# Patient Record
Sex: Female | Born: 1945 | Race: White | Hispanic: No | Marital: Married | Smoking: Never smoker
Health system: Southern US, Community
[De-identification: ages and names within clinical notes are randomized; demographics above are authoritative.]

## PROBLEM LIST (undated history)

## (undated) DIAGNOSIS — G4700 Insomnia, unspecified: Secondary | ICD-10-CM

## (undated) HISTORY — DX: Insomnia, unspecified: G47.00

---

## 1968-04-27 HISTORY — PX: APPENDECTOMY: SHX54

## 1998-02-20 ENCOUNTER — Ambulatory Visit: Admission: RE | Admit: 1998-02-20 | Discharge: 1998-02-20 | Payer: Self-pay | Admitting: Family Medicine

## 1998-02-20 ENCOUNTER — Encounter: Payer: Self-pay | Admitting: Family Medicine

## 1999-05-02 ENCOUNTER — Other Ambulatory Visit: Admission: RE | Admit: 1999-05-02 | Discharge: 1999-05-02 | Payer: Self-pay | Admitting: Family Medicine

## 1999-09-09 ENCOUNTER — Ambulatory Visit (HOSPITAL_COMMUNITY): Admission: RE | Admit: 1999-09-09 | Discharge: 1999-09-09 | Payer: Self-pay | Admitting: Family Medicine

## 1999-09-09 ENCOUNTER — Encounter: Payer: Self-pay | Admitting: Family Medicine

## 2000-05-17 ENCOUNTER — Encounter: Payer: Self-pay | Admitting: Emergency Medicine

## 2000-05-18 ENCOUNTER — Inpatient Hospital Stay (HOSPITAL_COMMUNITY): Admission: EM | Admit: 2000-05-18 | Discharge: 2000-05-18 | Payer: Self-pay | Admitting: Emergency Medicine

## 2001-01-10 ENCOUNTER — Encounter: Admission: RE | Admit: 2001-01-10 | Discharge: 2001-01-10 | Payer: Self-pay | Admitting: Family Medicine

## 2001-01-10 ENCOUNTER — Encounter: Payer: Self-pay | Admitting: Family Medicine

## 2002-10-04 ENCOUNTER — Ambulatory Visit (HOSPITAL_COMMUNITY): Admission: RE | Admit: 2002-10-04 | Discharge: 2002-10-04 | Payer: Self-pay | Admitting: Gastroenterology

## 2002-12-06 ENCOUNTER — Ambulatory Visit (HOSPITAL_COMMUNITY): Admission: RE | Admit: 2002-12-06 | Discharge: 2002-12-06 | Payer: Self-pay | Admitting: Family Medicine

## 2002-12-06 ENCOUNTER — Encounter: Payer: Self-pay | Admitting: Family Medicine

## 2004-05-30 ENCOUNTER — Other Ambulatory Visit: Admission: RE | Admit: 2004-05-30 | Discharge: 2004-05-30 | Payer: Self-pay | Admitting: Family Medicine

## 2004-07-22 ENCOUNTER — Ambulatory Visit (HOSPITAL_COMMUNITY): Admission: RE | Admit: 2004-07-22 | Discharge: 2004-07-22 | Payer: Self-pay | Admitting: Cardiology

## 2005-03-31 ENCOUNTER — Ambulatory Visit (HOSPITAL_COMMUNITY): Admission: RE | Admit: 2005-03-31 | Discharge: 2005-03-31 | Payer: Self-pay | Admitting: Family Medicine

## 2006-06-22 ENCOUNTER — Ambulatory Visit (HOSPITAL_COMMUNITY): Admission: RE | Admit: 2006-06-22 | Discharge: 2006-06-22 | Payer: Self-pay | Admitting: Family Medicine

## 2006-06-22 ENCOUNTER — Other Ambulatory Visit: Admission: RE | Admit: 2006-06-22 | Discharge: 2006-06-22 | Payer: Self-pay | Admitting: Family Medicine

## 2007-07-01 ENCOUNTER — Other Ambulatory Visit: Admission: RE | Admit: 2007-07-01 | Discharge: 2007-07-01 | Payer: Self-pay | Admitting: Obstetrics and Gynecology

## 2008-03-02 ENCOUNTER — Encounter: Admission: RE | Admit: 2008-03-02 | Discharge: 2008-03-02 | Payer: Self-pay | Admitting: Family Medicine

## 2008-03-13 ENCOUNTER — Encounter: Admission: RE | Admit: 2008-03-13 | Discharge: 2008-03-13 | Payer: Self-pay | Admitting: Family Medicine

## 2009-02-18 ENCOUNTER — Encounter: Admission: RE | Admit: 2009-02-18 | Discharge: 2009-02-18 | Payer: Self-pay | Admitting: Family Medicine

## 2009-03-04 ENCOUNTER — Encounter: Admission: RE | Admit: 2009-03-04 | Discharge: 2009-03-04 | Payer: Self-pay | Admitting: Family Medicine

## 2010-05-18 ENCOUNTER — Encounter: Payer: Self-pay | Admitting: Family Medicine

## 2010-09-12 NOTE — Discharge Summary (Signed)
[facility]  Patient:    Carla Huber, Carla Huber                    MRN: [id number] Adm. Date:  [id number] Disc. Date: 05/18/00 Attending:  Benita Stabile CC:         Dyanne Carrel, M.D.  Armanda Magic, M.D.   Discharge Summary  DISCHARGE DIAGNOSES: 1. Substernal chest pressure, the patient ruled out for MI. 2. Status post appendectomy. 3. Allergies to NSAIDS and SUDAFED.  DISCHARGE MEDICATIONS:  Aspirin 81 mg p.o. q.d.  CONDITION ON DISCHARGE:  The patient is denying chest pain, shortness of breath, nausea or vomiting. Has stable vital signs.  REASON FOR ADMISSION:  The patient is a 65 year old otherwise healthy female who was admitted for acute onset substernal chest pressure approximately 3-5/10 associated with nausea after arriving to the emergency room. She reported resolution of her chest pain after two nitroglycerin tablets. She notes that the onset of chest pressure was subsequent to activity. She denies any prior similar episodes.  HOSPITAL COURSE BY PROBLEM:  #1 - SUBSTERNAL CHEST PRESSURE. The patient was admitted to telemetry, initiated on aspirin and a beta blocker. Her chest pain had since resolved and did not recur during her hospital stay. An EKG did not reveal any acute ischemic changes. Normal sinus rhythm in the 60s with poor R wave progression otherwise normal. A fasting lipid panel was normal with an LDL of 80 and a total cholesterol of 158. Given the patients resolution of her presenting symptoms and no evidence of myocardial infarction, she will be discharged to home with follow-up on Thursday two days following discharge for a risk stratification as determined by Dr. Armanda Magic of cardiology.  The patient can follow-up with Dr. Manus Gunning, her primary care Augustin Bun, as needed.  DISCHARGE LABORATORY DATA:  Hemoglobin of 13.5, creatinine of 0.9, INR of 1.1, white count of 7, cholesterol of 158, LDL of 80.  Chest  x-ray no active disease. EKG as described above. CMET was within normal limits. DD:  05/18/00 TD:  05/18/00 Job: [id number]

## 2010-09-12 NOTE — H&P (Signed)
[facility]  Patient:    Carla Huber, Carla Huber                    MRN: [id number] Adm. Date:  [id number] Attending:  Benita Stabile                         History and Physical  CHIEF COMPLAINT:  Chest pain.  HISTORY OF PRESENT ILLNESS:  Patient is a 65 year old white female who was in her usual state until approximately four hours ago when she had done some lifting, carrying some suitcases, and relatively soon thereafter, started having some pressing sternal chest pain with tingling into her left arm and some shortness of breath but no nausea or diaphoresis.  She had not ever had any problems with this and had not done any other unusual activity.  She did do a seven-mile hike on [place]th [place] with no difficulty or problems.  She took an aspirin at home and did not notice much difference with that.  She has had GI cocktail, nitroglycerin and then morphine, with complete resolution of the pain, though she feels the lingering after-effects of the discomfort.  She is not currently having any pain.  PAST MEDICAL HISTORY:  Positive for appendectomy.  MEDICATIONS:  None.  ALLERGIES:  NSAID and SUDAFED.  SOCIAL HISTORY:  She does not smoke.  She has seven to eight glasses of wine per week.  She does occasional walking and she lives with her husband and works as a Runner, broadcasting/film/video.  FAMILY HISTORY:  Positive for an MI in her father at the age of 78.  Positive for congestive heart failure in her mother but no other known cardiac history.  REVIEW OF SYSTEMS:  Review of systems is negative for difficulty with cough, bowel movements, urination, bones, joints, numbness, tingling, weakness or any other new symptoms besides those listed above.  PHYSICAL EXAMINATION  GENERAL:  Well-developed, well-nourished white female in no apparent distress.  VITAL SIGNS:  Temperature 98, pulse 68, respirations 20, blood pressure 169/95.  HEENT:   Unremarkable.  NECK:  Good carotid upstroke with no bruits and no JVD or adenopathy.  LUNGS:  Clear with good air movement and no rales or wheeze.  CV:  Regular with no MGR.  CHEST:  Nontender to palpation and there is no visible or palpable defect.  ABDOMEN:  Soft with mild diffuse tenderness but no mass, guarding or rebound and bowel sounds normal.  EXTREMITIES:  Good ROM of all joints with normal pulses and no swelling.  NEUROLOGIC:  Exam reveals no focal changes.  LABORATORY AND X-RAY FINDINGS:  EKG shows possible ST changes in the anterior leads but no different from 1998 with EKG in the office.  Chest x-ray shows no active disease.  CK-MB and troponin are both negative.  Pro time, PTT, comprehensive metabolic and CBC are all normal.  IMPRESSION 1. Chest pain that has resolved, with possible cardiac versus other source. 2. Otherwise generally healthy.  PLAN:  Rule out MI labs and EKG, telemetry and observation.  Further plan will be based on the results of these but she will at least need probable Cardiolite for further evaluation if the testing is all negative. DD:  05/18/00 TD:  05/18/00 Job: [id number]

## 2010-09-12 NOTE — Op Note (Signed)
   NAME:  Carla Huber, Carla Huber                       ACCOUNT NO.:  [id number]   MEDICAL RECORD NO.:  [id number]                   PATIENT TYPE:  AMB   LOCATION:  ENDO                                 FACILITY:  [facility]   PHYSICIAN:  Graylin Shiver, M.D.                DATE OF BIRTH:  September 28, 1945   DATE OF PROCEDURE:  10/04/2002  DATE OF DISCHARGE:                                 OPERATIVE REPORT   PROCEDURE PERFORMED:  Colonoscopy.   INDICATIONS FOR PROCEDURE:  Intermittent rectal bleeding.   Informed consent was obtained.   PREMEDICATION:  Fentanyl 50 mcg IV, Versed 5 mg IV.   DESCRIPTION OF PROCEDURE:  With the patient in the left lateral decubitus  position, a rectal exam was performed and no masses were felt.  The Olympus  colonoscope was inserted into the rectum and advanced around the colon to  the cecum.  The cecal landmarks were identified.  The cecum and ascending  colon were normal.  The transverse colon was normal.  The descending colon,  sigmoid and rectum were normal.  The scope was brought out.  The patient  tolerated the procedure well without complications.  Inspection of the anal  area did show some external hemorrhoidal tags.   IMPRESSION:  Normal colonoscopy to the cecum with external hemorrhoidal tags  present.                                               Graylin Shiver, M.D.    Germain Osgood  D:  10/04/2002  T:  10/04/2002  Job:  [id number]   cc:   Bryan Lemma. Manus Gunning, M.D.  [address] [place]  [place] [address]  Fax: [phone]

## 2012-02-29 ENCOUNTER — Other Ambulatory Visit: Payer: Self-pay | Admitting: Internal Medicine

## 2012-02-29 DIAGNOSIS — Z1231 Encounter for screening mammogram for malignant neoplasm of breast: Secondary | ICD-10-CM

## 2012-03-29 ENCOUNTER — Ambulatory Visit
Admission: RE | Admit: 2012-03-29 | Discharge: 2012-03-29 | Disposition: A | Discharge status: Home / Self Care | Payer: Medicare Other | Source: Ambulatory Visit | Attending: Internal Medicine | Admitting: Internal Medicine

## 2012-03-29 DIAGNOSIS — Z1231 Encounter for screening mammogram for malignant neoplasm of breast: Secondary | ICD-10-CM

## 2014-01-23 ENCOUNTER — Other Ambulatory Visit: Payer: Self-pay

## 2014-01-23 ENCOUNTER — Ambulatory Visit
Admission: RE | Admit: 2014-01-23 | Discharge: 2014-01-23 | Disposition: A | Discharge status: Home / Self Care | Payer: Medicare Other | Source: Ambulatory Visit

## 2014-01-23 DIAGNOSIS — Z1231 Encounter for screening mammogram for malignant neoplasm of breast: Secondary | ICD-10-CM

## 2014-01-24 ENCOUNTER — Other Ambulatory Visit: Payer: Self-pay | Admitting: Internal Medicine

## 2014-01-24 DIAGNOSIS — N644 Mastodynia: Secondary | ICD-10-CM

## 2014-01-30 ENCOUNTER — Encounter (INDEPENDENT_AMBULATORY_CARE_PROVIDER_SITE_OTHER): Payer: Self-pay

## 2014-01-30 ENCOUNTER — Ambulatory Visit
Admission: RE | Admit: 2014-01-30 | Discharge: 2014-01-30 | Disposition: A | Discharge status: Home / Self Care | Payer: Medicare Other | Source: Ambulatory Visit | Attending: Internal Medicine | Admitting: Internal Medicine

## 2014-01-30 DIAGNOSIS — N644 Mastodynia: Secondary | ICD-10-CM

## 2014-05-09 ENCOUNTER — Ambulatory Visit (INDEPENDENT_AMBULATORY_CARE_PROVIDER_SITE_OTHER): Payer: Medicare Other | Admitting: Cardiovascular Disease

## 2014-05-09 ENCOUNTER — Encounter: Payer: Self-pay | Admitting: Cardiovascular Disease

## 2014-05-09 VITALS — BP 132/76 | HR 67 | Ht 65.5 in | Wt 144.4 lb

## 2014-05-09 DIAGNOSIS — R002 Palpitations: Secondary | ICD-10-CM

## 2014-05-09 HISTORY — DX: Palpitations: R00.2

## 2014-05-09 NOTE — Assessment & Plan Note (Signed)
Helga presents with history of palpitations. Suspect that she has premature ventricular contractions or premature atrial contractions. Chest the air sounds. They're not associated with any specific symptoms. They're also not associated with any specific activities.  We'll place a 30 day monitor her for further evaluation. Paragraph we'll see her again in 3 months. She'll try to keep track of what seems to bring on these palpitations.

## 2014-05-09 NOTE — Patient Instructions (Signed)
Your physician has recommended that you wear an event monitor. Event monitors are medical devices that record the heart's electrical activity. Doctors most often us these monitors to diagnose arrhythmias. Arrhythmias are problems with the speed or rhythm of the heartbeat. The monitor is a small, portable device. You can wear one while you do your normal daily activities. This is usually used to diagnose what is causing palpitations/syncope (passing out).  Your physician wants you to follow-up in: 3 months with Dr. Elease HashimotoNahser.  You will receive a reminder letter in the mail two months in advance. If you don't receive a letter, please call our office to schedule the follow-up appointment.

## 2014-05-09 NOTE — Progress Notes (Signed)
     Ezequiel EssexHelen V Gauthier Date of Birth  1945-12-17       [facility]    [place] [facility] [address]  [address], [address] [place], [place]  [address]   [place]  [id number] [phone]     [phone]   Fax  [phone]     Fax [phone]  Problem List: When. Heart murmur by 2. Palpitations  History of Present Illness:  Satira MccallumHelga is referred today by Dr. Waynard EdwardsPerini for evaluation of a heart murmur and palpitation  Helga has occasional episodes or random CP.  Only last for a brief moment.  Exercises regularly.  [place] and [place].    She has palpitations - at various times.  Not related to eating or drinking, not related to caffiene, , or change of position. Drinks half caffiene, / half decaf.  The palpitations feel like a flutter, lasts for less than a minute.  Not associated with lightheadedness, CHest pain,    Current Outpatient Prescriptions  Medication Sig Dispense Refill  . FLUoxetine (PROZAC) 20 MG capsule Take 20 mg by mouth daily.    . traZODone (DESYREL) 50 MG tablet Take 50 mg by mouth daily. AT BEDTIME FOR SLEEP     No current facility-administered medications for this visit.     No Known Allergies  Past Medical History  Diagnosis Date  . Insomnia     Past Surgical History  Procedure Laterality Date  . Appendectomy  1970    History  Smoking status  . Never Smoker   Smokeless tobacco  . Not on file    History  Alcohol Use  . 0.0 oz/week  . 0 Not specified per week    Comment: DAILY GLASS OF WINE    Family History  Problem Relation Age of Onset  . Uterine cancer Mother   . Heart failure Mother   . Heart attack Father   . Lymphoma Sister   . Heart attack Maternal Grandmother   . Heart attack Maternal Grandfather   . Heart attack Paternal Grandmother   . Heart attack Paternal Grandfather     Reviw of Systems:  Reviewed in the HPI.  All other systems are negative.  Physical  Exam: Blood pressure 132/76, pulse 67, height 5' 5.5" (1.664 m), weight 144 lb 6.4 oz (65.499 kg). Wt Readings from Last 3 Encounters:  05/09/14 144 lb 6.4 oz (65.499 kg)     General: Well developed, well nourished, in no acute distress.  Head: Normocephalic, atraumatic, sclera non-icteric, mucus membranes are moist,   Neck: Supple. Carotids are 2 + without bruits. No JVD   Lungs: Clear   Heart: Rr, normal S1S2  Abdomen: Soft, non-tender, non-distended with normal bowel sounds.  Msk:  Strength and tone are normal   Extremities: No clubbing or cyanosis. No edema.  Distal pedal pulses are 2+ and equal   Neuro: CN II - XII intact.  Alert and oriented X 3.   Psych:  Normal   ECG: 05/09/2014:  Normal sinus rhythm with first degree AV block.  Assessment / Plan:

## 2014-05-10 ENCOUNTER — Encounter: Payer: Self-pay | Admitting: *Deleted

## 2014-05-10 DIAGNOSIS — R002 Palpitations: Secondary | ICD-10-CM

## 2014-05-10 NOTE — Progress Notes (Signed)
Patient ID: Carla EssexHelen V Sigg, female   DOB: May 23, 1945, 69 y.o.   MRN: [id number] [employer] 30 day cardiac event monitor applied to patient.  [employer] notified to mail patient electrodes for sensitive skin.

## 2014-06-14 ENCOUNTER — Telehealth: Payer: Self-pay | Admitting: Nurse Practitioner

## 2014-06-14 NOTE — Telephone Encounter (Signed)
Left message for patient to call back for monitor results.  Per Dr. Elease HashimotoNahser:  Normal sinus rhythm with rare PAC's and PVC's

## 2014-06-18 NOTE — Telephone Encounter (Signed)
Spoke with patient and reviewed monitor results.  Patient verbalized understanding and will follow up with Dr. Elease HashimotoNahser as directed in May.

## 2014-08-08 ENCOUNTER — Ambulatory Visit (INDEPENDENT_AMBULATORY_CARE_PROVIDER_SITE_OTHER): Payer: Medicare Other | Admitting: Cardiovascular Disease

## 2014-08-08 ENCOUNTER — Encounter: Payer: Self-pay | Admitting: Cardiovascular Disease

## 2014-08-08 VITALS — BP 120/70 | HR 63 | Ht 65.5 in | Wt 145.1 lb

## 2014-08-08 DIAGNOSIS — R002 Palpitations: Secondary | ICD-10-CM | POA: Diagnosis not present

## 2014-08-08 NOTE — Progress Notes (Signed)
Cardiology Office Note   Date:  08/08/2014   ID:  Carla Huber, DOB 08-17-1945, MRN [id number]  PCP:  Ezequiel Kayser, MD  Cardiologist:   Vesta Mixer, MD   Chief Complaint  Patient presents with  . Palpitations   1. Heart murmur 2. Palpitations - PACs on event monitor    History of Present Illness: Carla Huber is a 69 y.o. female who presents for follow-up of her palpitations. She had an event monitor which revealed several premature atrial contractions. She's done very well. She still occasionally has premature atrial contractions but they do not cause any shortness of breath, chest pain or dizziness. She has remained active. She exercises on a regular basis.     Past Medical History  Diagnosis Date  . Insomnia     Past Surgical History  Procedure Laterality Date  . Appendectomy  1970     Current Outpatient Prescriptions  Medication Sig Dispense Refill  . FLUoxetine (PROZAC) 20 MG capsule Take 20 mg by mouth daily.    . traZODone (DESYREL) 50 MG tablet Take 50 mg by mouth daily. AT BEDTIME FOR SLEEP     No current facility-administered medications for this visit.    Allergies:   Review of patient's allergies indicates no known allergies.    Social History:  The patient  reports that she has never smoked. She does not have any smokeless tobacco history on file. She reports that she drinks alcohol. She reports that she does not use illicit drugs.   Family History:  The patient's family history includes Heart attack in her father, maternal grandfather, maternal grandmother, paternal grandfather, and paternal grandmother; Heart failure in her mother; Lymphoma in her sister; Uterine cancer in her mother.    ROS:  Please see the history of present illness.    Review of Systems: Constitutional:  denies fever, chills, diaphoresis, appetite change and fatigue.  HEENT: denies photophobia, eye pain, redness, hearing loss, ear pain, congestion, sore throat,  rhinorrhea, sneezing, neck pain, neck stiffness and tinnitus.  Respiratory: denies SOB, DOE, cough, chest tightness, and wheezing.  Cardiovascular: denies chest pain, palpitations and leg swelling.  Gastrointestinal: denies nausea, vomiting, abdominal pain, diarrhea, constipation, blood in stool.  Genitourinary: denies dysuria, urgency, frequency, hematuria, flank pain and difficulty urinating.  Musculoskeletal: denies  myalgias, back pain, joint swelling, arthralgias and gait problem.   Skin: denies pallor, rash and wound.  Neurological: denies dizziness, seizures, syncope, weakness, light-headedness, numbness and headaches.   Hematological: denies adenopathy, easy bruising, personal or family bleeding history.  Psychiatric/ Behavioral: denies suicidal ideation, mood changes, confusion, nervousness, sleep disturbance and agitation.       All other systems are reviewed and negative.    PHYSICAL EXAM: VS:  BP 120/70 mmHg  Pulse 63  Ht 5' 5.5" (1.664 m)  Wt 145 lb 1.9 oz (65.826 kg)  BMI 23.77 kg/m2 , BMI Body mass index is 23.77 kg/(m^2). GEN: Well nourished, well developed, in no acute distress HEENT: normal Neck: no JVD, carotid bruits, or masses Cardiac: RRR; no murmurs, rubs, or gallops,no edema  Respiratory:  clear to auscultation bilaterally, normal work of breathing GI: soft, nontender, nondistended, + BS MS: no deformity or atrophy Skin: warm and dry, no rash Neuro:  Strength and sensation are intact Psych: normal   EKG:  EKG is not ordered today.    Recent Labs: No results found for requested labs within last 365 days.    Lipid Panel No results found for:  CHOL, TRIG, HDL, CHOLHDL, VLDL, LDLCALC, LDLDIRECT    Wt Readings from Last 3 Encounters:  08/08/14 145 lb 1.9 oz (65.826 kg)  05/09/14 144 lb 6.4 oz (65.499 kg)      Other studies Reviewed: Additional studies/ records that were reviewed today include: . Review of the above records demonstrates:     ASSESSMENT AND PLAN:  1.  Palpitations:   Monitor revealed premature atrial contractions. These are completely benign. She does not need any specific therapy.  2. Heart murmur: She has a very soft heart murmur. She may have trivial mitral regurgitation or trivial tricuspid regurgitation. I do not think that she needs any additional follow-up. I'll be happy to see her on an as-needed basis.   Current medicines are reviewed at length with the patient today.  The patient does not have concerns regarding medicines.  The following changes have been made:  no change  Labs/ tests ordered today include:  No orders of the defined types were placed in this encounter.     Disposition:   FU with me as needed.      Signed, Kripa Foskey, Deloris PingPhilip J, MD  08/08/2014 4:01 PM    [facility] [address], [place], [place]  [address] Phone: [phone]; Fax: [phone]

## 2014-08-08 NOTE — Patient Instructions (Signed)
Medication Instructions:  Your physician recommends that you continue on your current medications as directed. Please refer to the Current Medication list given to you today.   Labwork: None  Testing/Procedures: None  Follow-Up: Your physician recommends that you schedule a follow-up appointment in: as needed with Dr. Nahser      

## 2015-01-04 ENCOUNTER — Inpatient Hospital Stay: Admission: RE | Admit: 2015-01-04 | Payer: Medicare Other | Source: Ambulatory Visit

## 2015-01-04 ENCOUNTER — Other Ambulatory Visit: Payer: Self-pay | Admitting: Internal Medicine

## 2015-01-04 DIAGNOSIS — R0789 Other chest pain: Secondary | ICD-10-CM

## 2015-02-21 ENCOUNTER — Ambulatory Visit (INDEPENDENT_AMBULATORY_CARE_PROVIDER_SITE_OTHER): Payer: Medicare Other | Admitting: Pulmonary Disease

## 2015-02-21 VITALS — BP 120/68 | HR 66 | Temp 97.6°F | Wt 145.0 lb

## 2015-02-21 DIAGNOSIS — Z779 Other contact with and (suspected) exposures hazardous to health: Secondary | ICD-10-CM | POA: Insufficient documentation

## 2015-02-21 DIAGNOSIS — R05 Cough: Secondary | ICD-10-CM | POA: Insufficient documentation

## 2015-02-21 DIAGNOSIS — T7589XA Other specified effects of external causes, initial encounter: Secondary | ICD-10-CM

## 2015-02-21 DIAGNOSIS — IMO0001 Reserved for inherently not codable concepts without codable children: Secondary | ICD-10-CM

## 2015-02-21 DIAGNOSIS — R0600 Dyspnea, unspecified: Secondary | ICD-10-CM | POA: Insufficient documentation

## 2015-02-21 DIAGNOSIS — R059 Cough, unspecified: Secondary | ICD-10-CM

## 2015-02-21 HISTORY — DX: Other contact with and (suspected) exposures hazardous to health: Z77.9

## 2015-02-21 HISTORY — DX: Cough, unspecified: R05.9

## 2015-02-21 HISTORY — DX: Dyspnea, unspecified: R06.00

## 2015-02-21 MED ORDER — BUDESONIDE-FORMOTEROL FUMARATE 160-4.5 MCG/ACT IN AERO: 2.0000 | INHALATION_SPRAY | Freq: Two times a day (BID) | RESPIRATORY_TRACT | Status: DC

## 2015-02-21 MED ORDER — CLONAZEPAM 0.5 MG PO TABS: 0.5000 mg | ORAL_TABLET | Freq: Two times a day (BID) | ORAL | Status: DC

## 2015-02-21 NOTE — Patient Instructions (Signed)
Carla JacobsonHelen-- it was nice meeting you today...  It appears to me that your exposure to the chlorox in Sept caused some irritation to your airways and produced this reaction...  Your prev CXRs are reported clear and your Spirometry breathing test today showed some small airways inflammation     along with some mild restriction that is likely from some chest wall musc spasm...  We decided to treat BOTH components to help eliminate your symptoms (shortness of breath & cough)...     In this regard I would like you to continue the SYMBICORT160- 2 sprays twice daily & you may use the Proair rescue inhaler as needed...    For the restrictive component we decided to give you a trial of a combination relaxer to relax those chest wall muscles- KLONOPIN 0.5mg  take one tab twice daily...  Please give me a call in about 2 weeks to let me know how you are doing (we can adjust doses at that time if necessary)...  Let's plan a follow up visit in 4-6weeks, sooner if needed for any problems.Marland Kitchen..Marland Kitchen

## 2015-02-23 ENCOUNTER — Encounter: Payer: Self-pay | Admitting: Pulmonary Disease

## 2015-02-23 NOTE — Progress Notes (Signed)
Subjective:     Patient ID: Carla Huber, female   DOB: 01-21-1946, 69 y.o.   MRN: [id number]  HPI ~  February 21, 2015:  Initial pulmonary consult w/ SN>        Latrish tells me she was recently diagnosed w/ asthma w/ symptoms atarting 11/2014 when she noted a leaky roof, black mold on her ceiling, cleaned it w/ chlorox & she feels that she had a reaction to it "like an alien was in my chest" w/ wheezing, coughing, sm amt clear sput, no hemoptysis, and noted this was had to shake off; she had CXR from DrPerini which was reported clear, and treated w/ Breo, Singulair, Zyrtek, and her 1st round of Prednisone; she noted mild benefit but symptoms were not resolved so they check a D-dimer (elev) which led to a CT Angio- Neg, no PE, and she was given another round of Pred; then she went on vacation to [place] (9/16) & did very well in that environment w/ cool/ dry air; on return to [place] her symptoms recurred w/ cough/ wheezing/ SOB this time w/ small amt yellow sputum & chest discomfort/ tightness; on further questioning she notes the dyspnea is "like I can't get a full breath" but she has continued her exercise w/ walking regularly & doing satis she says;  When she saw DrPerini about 2weeks ago- she was switched to Symbicort, given Proair for prn use, and a 3rd round of Pred...   Smoking Hx>  She was a minimal smoker in the past, smoking ~1/2ppd betw ages [age] for a total 1 pack-yr smoking hx!  She denies much 2nd hand exposure in her life as well, she notes that they occas heat w/ wood stove...   Pulmonary Hx>  No mention of asthma as a child or into adulthood until this illness in Aug2016; she had pneumonia x1 in high school & describes a serious illness, hosp x3wks, but made a full recovery w/o known sequelae...    Medical Hx>  Good general medical health- Hx of migraines and remote bout of encephalitis in 1980, post-menopausal, osteopenia, mild DJD, hx non-melanoma skin cancers, hx depression...   Family Hx>  FamHx is neg for pulmonary diseases; Father had heart dis & CHF...  Occup Hx>  She is a retired Runner, broadcasting/film/video & denies any known hx of toxic exposures to asbestos etc, until this episode w/ the chlorox treatment for the ceiling mold at home...   Current Meds>  Symbicort160-2spBid, Singulair10, ProairHFA prn, ASA325, Vits, prev on Prozac/ Desyrel...      EXAM shows Afeb, VSS, O2sat=99%;  Wt=149#, 5'6"Tall, BMI=24;  HEENT- neg, mallampati1;  Chest- clear, sl dry cough;  Heart- RR w/o m/r/g;  Abd- soft, neg;  Ext- neg w/o c/c/e...  CXR 02/06/15 ([facility]- films not avail) mild cardiomeg & ectatic Ao; clear lungs, ?mild hyperinflation, NAD...   Spirometry 02/21/15 shows FVC=2.20 (71%), FEV1=1.62 (68%), %1sec=74, mid-flows are reduced at 60% predicted... This is c/w mild restriction w/ superimposed small airways dis...   FeNO 02/21/15 shows FeNO level = 28 (nl<25, intermed 25-50, Hi>50)  LABS> 9/16 at [facility]- BMet- wnl;  CBC- wnl;         IMP/PLAN>>  By her history Lianni had normal resp health prior to the chlorox exposure 11/2014 (she had a serious pneumonia in high school but made a full recovery w/ no known problems after that illness);  It appears as though this exposure triggered an irritant reaction within her airways that  trequired several rounds of meds including Pred; curious that her best response occurred while on vacation in [place]; on return she noted to me that the SOB was "like I can't get a full breath" yet she is walking regularly & doing satis... Spirometry showed what looks like mild restriction (I suspect from chest wall factors since lungs are clear) & superimposed small airways dis (likely the resid inflamm from the exposure); in this regard we discussed further treatment w/ KLONOPIN 0.5mg  Bid for the "chest wall muscle spasm" and continue her SYMBICORT160-2spBid & PROAIR-HFA rescue inhaler prn for the airways component... I have asked her to call me  w/ a report on how's she's doing in 2 wks and we will see her back in 4-6wks...     Past Medical History  Diagnosis Date  . Insomnia   NOTES from DrPerini indicates PMH-- migraines and remote bout of encephalitis in 1980, post-menopausal, osteopenia, mild DJD, hx non-melanoma skin cancers, hx depression   Past Surgical History  Procedure Laterality Date  . Appendectomy  1970    Outpatient Encounter Prescriptions as of 02/21/2015  Medication Sig  . aspirin 325 MG tablet Take 175 mg by mouth daily.  Marland Kitchen b complex vitamins tablet Take 1 tablet by mouth daily.  Marland Kitchen FLUoxetine (PROZAC) 20 MG capsule Take 20 mg by mouth daily.  . magnesium oxide (MAG-OX) 400 MG tablet Take 400 mg by mouth daily.  . montelukast (SINGULAIR) 10 MG tablet Take 10 mg by mouth at bedtime.  . traZODone (DESYREL) 50 MG tablet Take 50 mg by mouth daily. AT BEDTIME FOR SLEEP  . vitamin C (ASCORBIC ACID) 250 MG tablet Take 250 mg by mouth daily.  . budesonide-formoterol (SYMBICORT) 160-4.5 MCG/ACT inhaler Inhale 2 puffs into the lungs 2 (two) times daily.    No Known Allergies   Family History  Problem Relation Age of Onset  . Uterine cancer Mother   . Heart failure Mother   . Heart attack Father   . Lymphoma Sister   . Heart attack Maternal Grandmother   . Heart attack Maternal Grandfather   . Heart attack Paternal Grandmother   . Heart attack Paternal Grandfather     Social History   Social History  . Marital Status: Married    Spouse Name: N/A  . Number of Children: N/A  . Years of Education: N/A   Occupational History  . Not on file.   Social History Main Topics  . Smoking status: Never Smoker   . Smokeless tobacco: Not on file  . Alcohol Use: 0.0 oz/week    0 Standard drinks or equivalent per week     Comment: DAILY GLASS OF WINE  . Drug Use: No  . Sexual Activity: Not on file   Other Topics Concern  . Not on file   Social History Narrative    Current Medications, Allergies, Past  Medical History, Past Surgical History, Family History, and Social History were reviewed in [employer] record.   Review of Systems             All symptoms NEG except where BOLDED >>  Constitutional:  F/C/S, fatigue, anorexia, unexpected weight change. HEENT:  HA, visual changes, hearing loss, earache, nasal symptoms, sore throat, mouth sores, hoarseness. Resp:  cough, sputum, hemoptysis; SOB, tightness, wheezing. Cardio:  CP, palpit, DOE, orthopnea, edema. GI:  N/V/D/C, blood in stool; reflux, abd pain, distention, gas. GU:  dysuria, freq, urgency, hematuria, flank pain, voiding difficulty. MS:  joint pain,  swelling, tenderness, decr ROM; neck pain, back pain, etc. Neuro:  HA, tremors, seizures, dizziness, syncope, weakness, numbness, gait abn. Skin:  suspicious lesions or skin rash. Heme:  adenopathy, bruising, bleeding. Psyche:  confusion, agitation, sleep disturbance, hallucinations, anxiety, depression suicidal.   Objective:   Physical Exam       Vital Signs:  Reviewed...  General:  WD, WN, 69 y/o WF in NAD; alert & oriented; pleasant & cooperative... HEENT:  [place]/AT; Conjunctiva- pink, Sclera- nonicteric, EOM-wnl, PERRLA, Fundi-benign; EACs-clear, TMs-wnl; NOSE-clear; THROAT-clear & wnl. Neck:  Supple w/ fair ROM; no JVD; normal carotid impulses w/o bruits; no thyromegaly or nodules palpated; no lymphadenopathy. Chest:  Clear to P & A; without wheezes, rales, or rhonchi heard. Heart:  Regular Rhythm; norm S1 & S2 without murmurs, rubs, or gallops detected. Abdomen:  Soft & nontender- no guarding or rebound; normal bowel sounds; no organomegaly or masses palpated. Ext:  Normal ROM; without deformities or arthritic changes; no varicose veins, venous insuffic, or edema;  Pulses intact w/o bruits. Neuro:  CNs II-XII intact; motor testing normal; sensory testing normal; gait normal & balance OK. Derm:  No lesions noted; no rash etc. Lymph:  No cervical,  supraclavicular, axillary, or inguinal adenopathy palpated.   Assessment:           IMP/PLAN>>  By her history Myriam JacobsonHelen had normal resp health prior to the chlorox exposure 11/2014 (she had a serious pneumonia in high school but made a full recovery w/ no known problems after that illness);  It appears as though this exposure triggered an irritant reaction within her airways that trequired several rounds of meds including Pred; curious that her best response occurred while on vacation in [place]; on return she noted to me that the SOB was "like I can't get a full breath" yet she is walking regularly & doing satis... Spirometry showed what looks like mild restriction (I suspect from chest wall factors since lungs are clear) & superimposed small airways dis (likely the resid inflamm from the exposure); in this regard we discussed further treatment w/ KLONOPIN 0.5mg  Bid for the "chest wall muscle spasm" and continue her SYMBICORT160-2spBid & PROAIR-HFA rescue inhaler prn for the airways component... I have asked her to call me w/ a report on how's she's doing in 2 wks and we will see her back in 4-6wks.     Plan:     Patient's Medications  New Prescriptions   BUDESONIDE-FORMOTEROL (SYMBICORT) 160-4.5 MCG/ACT INHALER    Inhale 2 puffs into the lungs 2 (two) times daily.   CLONAZEPAM (KLONOPIN) 0.5 MG TABLET    Take 1 tablet (0.5 mg total) by mouth 2 (two) times daily.  Previous Medications   ASPIRIN 325 MG TABLET    Take 175 mg by mouth daily.   B COMPLEX VITAMINS TABLET    Take 1 tablet by mouth daily.   FLUOXETINE (PROZAC) 20 MG CAPSULE    Take 20 mg by mouth daily.   MAGNESIUM OXIDE (MAG-OX) 400 MG TABLET    Take 400 mg by mouth daily.   MONTELUKAST (SINGULAIR) 10 MG TABLET    Take 10 mg by mouth at bedtime.   TRAZODONE (DESYREL) 50 MG TABLET    Take 50 mg by mouth daily. AT BEDTIME FOR SLEEP   VITAMIN C (ASCORBIC ACID) 250 MG TABLET    Take 250 mg by mouth daily.  Modified Medications   No  medications on file  Discontinued Medications   No medications on file

## 2015-03-28 ENCOUNTER — Ambulatory Visit: Payer: Medicare Other | Admitting: Pulmonary Disease

## 2015-03-28 ENCOUNTER — Encounter: Payer: Self-pay | Admitting: Pulmonary Disease

## 2015-03-28 VITALS — BP 116/70 | HR 62 | Temp 97.8°F | Resp 16 | Wt 148.4 lb

## 2015-03-28 DIAGNOSIS — R059 Cough, unspecified: Secondary | ICD-10-CM

## 2015-03-28 DIAGNOSIS — R05 Cough: Secondary | ICD-10-CM

## 2015-03-28 DIAGNOSIS — T7589XD Other specified effects of external causes, subsequent encounter: Principal | ICD-10-CM

## 2015-03-28 DIAGNOSIS — R0600 Dyspnea, unspecified: Secondary | ICD-10-CM

## 2015-03-28 DIAGNOSIS — IMO0001 Reserved for inherently not codable concepts without codable children: Secondary | ICD-10-CM

## 2015-03-28 NOTE — Progress Notes (Signed)
Subjective:     Patient ID: Carla Huber, female   DOB: June 29, 1945, 69 y.o.   MRN: [id number]  HPI  ~  February 21, 2015:  Initial pulmonary consult w/ SN>        Carla Huber tells me she was recently diagnosed w/ asthma w/ symptoms atarting 11/2014 when she noted a leaky roof, black mold on her ceiling, cleaned it w/ chlorox & she feels that she had a reaction to it "like an alien was in my chest" w/ wheezing, coughing, sm amt clear sput, no hemoptysis, and noted this was had to shake off; she had CXR from DrPerini which was reported clear, and treated w/ Breo, Singulair, Zyrtek, and her 1st round of Prednisone; she noted mild benefit but symptoms were not resolved so they check a D-dimer (elev) which led to a CT Angio- Neg, no PE, and she was given another round of Pred; then she went on vacation to [place] (9/16) & did very well in that environment w/ cool/ dry air; on return to [place] her symptoms recurred w/ cough/ wheezing/ SOB this time w/ small amt yellow sputum & chest discomfort/ tightness; on further questioning she notes the dyspnea is "like I can't get a full breath" but she has continued her exercise w/ walking regularly & doing satis she says;  When she saw DrPerini about 2weeks ago- she was switched to Symbicort, given Proair for prn use, and a 3rd round of Pred...   Smoking Hx>  She was a minimal smoker in the past, smoking ~1/2ppd betw ages [age] for a total 1 pack-yr smoking hx!  She denies much 2nd hand exposure in her life as well, she notes that they occas heat w/ wood stove...   Pulmonary Hx>  No mention of asthma as a child or into adulthood until this illness in Aug2016; she had pneumonia x1 in high school & describes a serious illness, hosp x3wks, but made a full recovery w/o known sequelae...    Medical Hx>  Good general medical health- Hx of migraines and remote bout of encephalitis in 1980, post-menopausal, osteopenia, mild DJD, hx non-melanoma skin cancers, hx depression...   Family Hx>  FamHx is neg for pulmonary diseases; Father had heart dis & CHF...  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   Current Meds>  Symbicort160-2spBid, Singulair10, ProairHFA prn, ASA325, Vits, prev on Prozac/ Desyrel...     EXAM shows Afeb, VSS, O2sat=99%;  Wt=149#, 5'6"Tall, BMI=24;  HEENT- neg, mallampati1;  Chest- clear, sl dry cough;  Heart- RR w/o m/r/g;  Abd- soft, neg;  Ext- neg w/o c/c/e...  [facility]06[facility]15 [facility]) mild cardiomeg & ectatic Ao; clear lungs, ?mild hyperinflation, NAD...   Spirometry 02/21/15 shows FVC=2.20 (71%), FEV1=1.62 (68%), %1sec=74, mid-flows are reduced at 60% predicted... This is c/w mild restriction w/ superimposed small airways dis...   FeNO 02/21/15 shows FeNO level = 28 (nl<25, intermed 25-50, Hi>50)  [facility]- wnl;  CBC- wnl;        IMP/PLAN>>  By her history Carla Huber had normal resp health prior to the chlorox exposure 11/2014 (she had a serious pneumonia in high school but made a full recovery w/ no known problems after that illness);  It appears as though this exposure triggered an irritant reaction within her airways that trequired  several rounds of meds including Pred; curious that her best response occurred while on vacation in [place]; on return she noted to me that the SOB was "like I can't get a full breath" yet she is walking regularly & doing satis... Spirometry showed what looks like mild restriction (I suspect from chest wall factors since lungs are clear) & superimposed small airways dis (likely the resid inflamm from the exposure); in this regard we discussed further treatment w/ KLONOPIN 0.5mg  Bid for the "chest wall muscle spasm" and continue her SYMBICORT160-2spBid & PROAIR-HFA rescue inhaler prn for the airways component... I have asked her to call me w/ a  report on how's she's doing in 2 wks and we will see her back in 4-6wks...   ~  March 27, 2015:  32mo ROV w/ SN>         Past Medical History  Diagnosis Date  . Insomnia   NOTES from DrPerini indicates PMH-- migraines and remote bout of encephalitis in 1980, post-menopausal, osteopenia, mild DJD, hx non-melanoma skin cancers, hx depression   Past Surgical History  Procedure Laterality Date  . Appendectomy  1970    Outpatient Encounter Prescriptions as of 03/28/2015  Medication Sig  . aspirin 325 MG tablet Take 175 mg by mouth daily.  Marland Kitchen b complex vitamins tablet Take 1 tablet by mouth daily.  . budesonide-formoterol (SYMBICORT) 160-4.5 MCG/ACT inhaler Inhale 2 puffs into the lungs 2 (two) times daily.  . clonazePAM (KLONOPIN) 0.5 MG tablet Take 1 tablet (0.5 mg total) by mouth 2 (two) times daily.  Marland Kitchen FLUoxetine (PROZAC) 20 MG capsule Take 20 mg by mouth daily.  . magnesium oxide (MAG-OX) 400 MG tablet Take 400 mg by mouth daily.  . montelukast (SINGULAIR) 10 MG tablet Take 10 mg by mouth at bedtime.  . traZODone (DESYREL) 50 MG tablet Take 50 mg by mouth daily. AT BEDTIME FOR SLEEP  . vitamin C (ASCORBIC ACID) 250 MG tablet Take 250 mg by mouth daily.   No facility-administered encounter medications on file as of 03/28/2015.    No Known Allergies   Current Medications, Allergies, Past Medical History, Past Surgical History, Family History, and Social History were reviewed in [employer] record.   Review of Systems             All symptoms NEG except where BOLDED >>  Constitutional:  F/C/S, fatigue, anorexia, unexpected weight change. HEENT:  HA, visual changes, hearing loss, earache, nasal symptoms, sore throat, mouth sores, hoarseness. Resp:  cough, sputum, hemoptysis; SOB, tightness, wheezing. Cardio:  CP, palpit, DOE, orthopnea, edema. GI:  N/V/D/C, blood in stool; reflux, abd pain, distention, gas. GU:  dysuria, freq, urgency, hematuria,  flank pain, voiding difficulty. MS:  joint pain, swelling, tenderness, decr ROM; neck pain, back pain, etc. Neuro:  HA, tremors, seizures, dizziness, syncope, weakness, numbness, gait abn. Skin:  suspicious lesions or skin rash. Heme:  adenopathy, bruising, bleeding. Psyche:  confusion, agitation, sleep disturbance, hallucinations, anxiety, depression suicidal.   Objective:   Physical Exam       Vital Signs:  Reviewed...  General:  WD, WN, 69 y/o WF in NAD; alert & oriented; pleasant & cooperative... HEENT:  [place]/AT; Conjunctiva- pink, Sclera- nonicteric, EOM-wnl, PERRLA, Fundi-benign; EACs-clear, TMs-wnl; NOSE-clear; THROAT-clear & wnl. Neck:  Supple w/ fair ROM; no JVD; normal carotid impulses w/o bruits; no thyromegaly or nodules palpated; no lymphadenopathy. Chest:  Clear to P & A; without wheezes, rales, or rhonchi heard. Heart:  Regular Rhythm; norm S1 &  S2 without murmurs, rubs, or gallops detected. Abdomen:  Soft & nontender- no guarding or rebound; normal bowel sounds; no organomegaly or masses palpated. Ext:  Normal ROM; without deformities or arthritic changes; no varicose veins, venous insuffic, or edema;  Pulses intact w/o bruits. Neuro:  CNs II-XII intact; motor testing normal; sensory testing normal; gait normal & balance OK. Derm:  No lesions noted; no rash etc. Lymph:  No cervical, supraclavicular, axillary, or inguinal adenopathy palpated.   Assessment:           IMP/PLAN>>  By her history Dejai had normal resp health prior to the chlorox exposure 11/2014 (she had a serious pneumonia in high school but made a full recovery w/ no known problems after that illness);  It appears as though this exposure triggered an irritant reaction within her airways that trequired several rounds of meds including Pred; curious that her best response occurred while on vacation in [place]; on return she noted to me that the SOB was "like I can't get a full breath" yet she is walking regularly &  doing satis... Spirometry showed what looks like mild restriction (I suspect from chest wall factors since lungs are clear) & superimposed small airways dis (likely the resid inflamm from the exposure); in this regard we discussed further treatment w/ KLONOPIN 0.5mg  Bid for the "chest wall muscle spasm" and continue her SYMBICORT160-2spBid & PROAIR-HFA rescue inhaler prn for the airways component... I have asked her to call me w/ a report on how's she's doing in 2 wks and we will see her back in 4-6wks.     Plan:     Patient's Medications  New Prescriptions   No medications on file  Previous Medications   ASPIRIN 325 MG TABLET    Take 175 mg by mouth daily.   B COMPLEX VITAMINS TABLET    Take 1 tablet by mouth daily.   BUDESONIDE-FORMOTEROL (SYMBICORT) 160-4.5 MCG/ACT INHALER    Inhale 2 puffs into the lungs 2 (two) times daily.   CLONAZEPAM (KLONOPIN) 0.5 MG TABLET    Take 1 tablet (0.5 mg total) by mouth 2 (two) times daily.   FLUOXETINE (PROZAC) 20 MG CAPSULE    Take 20 mg by mouth daily.   MAGNESIUM OXIDE (MAG-OX) 400 MG TABLET    Take 400 mg by mouth daily.   MONTELUKAST (SINGULAIR) 10 MG TABLET    Take 10 mg by mouth at bedtime.   TRAZODONE (DESYREL) 50 MG TABLET    Take 50 mg by mouth daily. AT BEDTIME FOR SLEEP   VITAMIN C (ASCORBIC ACID) 250 MG TABLET    Take 250 mg by mouth daily.  Modified Medications   No medications on file  Discontinued Medications   No medications on file

## 2015-03-28 NOTE — Patient Instructions (Signed)
Today we updated your med list in our EPIC system...    Continue your current medications the same...  For the cough & irritation symptoms> use the SYMBICORT 1-2 sprays twice daily for control...  For the shortness of breath & can't get a full breath sensation> use the Klonopin 0.5mg  once or twice daily...  Call for any questions or if we can be of service in any way.Marland Kitchen..Marland Kitchen

## 2015-04-01 ENCOUNTER — Encounter: Payer: Self-pay | Admitting: Pulmonary Disease

## 2015-05-30 ENCOUNTER — Other Ambulatory Visit: Payer: Self-pay | Admitting: Internal Medicine

## 2015-05-30 DIAGNOSIS — R05 Cough: Secondary | ICD-10-CM

## 2015-05-30 DIAGNOSIS — R059 Cough, unspecified: Secondary | ICD-10-CM

## 2015-05-31 ENCOUNTER — Ambulatory Visit
Admission: RE | Admit: 2015-05-31 | Discharge: 2015-05-31 | Disposition: A | Discharge status: Home / Self Care | Payer: Medicare Other | Source: Ambulatory Visit | Attending: Internal Medicine | Admitting: Internal Medicine

## 2015-05-31 ENCOUNTER — Ambulatory Visit: Payer: Medicare Other | Admitting: Acute Care

## 2015-05-31 DIAGNOSIS — R059 Cough, unspecified: Secondary | ICD-10-CM

## 2015-05-31 DIAGNOSIS — R05 Cough: Secondary | ICD-10-CM

## 2015-05-31 MED ORDER — IOPAMIDOL (ISOVUE-300) INJECTION 61%
75.0000 mL | Freq: Once | INTRAVENOUS | Status: AC | PRN
  Administered 2015-05-31: 75 mL via INTRAVENOUS

## 2015-06-03 ENCOUNTER — Other Ambulatory Visit: Payer: Medicare Other

## 2015-06-12 ENCOUNTER — Other Ambulatory Visit: Payer: Medicare Other

## 2015-06-20 ENCOUNTER — Ambulatory Visit (HOSPITAL_COMMUNITY): Payer: Medicare Other | Attending: Cardiovascular Disease

## 2015-06-20 ENCOUNTER — Other Ambulatory Visit: Payer: Self-pay

## 2015-06-20 ENCOUNTER — Other Ambulatory Visit: Payer: Self-pay | Admitting: Internal Medicine

## 2015-06-20 DIAGNOSIS — I272 Other secondary pulmonary hypertension: Secondary | ICD-10-CM | POA: Insufficient documentation

## 2015-06-20 DIAGNOSIS — I517 Cardiomegaly: Secondary | ICD-10-CM | POA: Insufficient documentation

## 2015-06-20 DIAGNOSIS — I351 Nonrheumatic aortic (valve) insufficiency: Secondary | ICD-10-CM | POA: Insufficient documentation

## 2015-07-01 ENCOUNTER — Ambulatory Visit: Payer: Medicare Other | Admitting: Cardiovascular Disease

## 2015-07-25 ENCOUNTER — Ambulatory Visit: Payer: Medicare Other | Admitting: Cardiovascular Disease

## 2015-07-26 ENCOUNTER — Ambulatory Visit (INDEPENDENT_AMBULATORY_CARE_PROVIDER_SITE_OTHER): Payer: Medicare Other | Admitting: Cardiovascular Disease

## 2015-07-26 ENCOUNTER — Encounter: Payer: Self-pay | Admitting: Cardiovascular Disease

## 2015-07-26 VITALS — BP 100/76 | HR 61 | Ht 65.5 in | Wt 147.0 lb

## 2015-07-26 DIAGNOSIS — E785 Hyperlipidemia, unspecified: Secondary | ICD-10-CM

## 2015-07-26 DIAGNOSIS — I2510 Atherosclerotic heart disease of native coronary artery without angina pectoris: Secondary | ICD-10-CM

## 2015-07-26 DIAGNOSIS — I10 Essential (primary) hypertension: Secondary | ICD-10-CM | POA: Diagnosis not present

## 2015-07-26 DIAGNOSIS — I2584 Coronary atherosclerosis due to calcified coronary lesion: Secondary | ICD-10-CM | POA: Diagnosis not present

## 2015-07-26 HISTORY — DX: Atherosclerotic heart disease of native coronary artery without angina pectoris: I25.10

## 2015-07-26 MED ORDER — LOSARTAN POTASSIUM 50 MG PO TABS: 50.0000 mg | ORAL_TABLET | Freq: Every day | ORAL | Status: DC

## 2015-07-26 NOTE — Progress Notes (Signed)
Cardiology Office Note   Date:  07/26/2015   ID:  Carla Huber, DOB 06-16-1945, MRN [id number]  PCP:  Carla Huber,Carla Huber, Carla Huber  Cardiologist:   Carla Huber, Carla Huber, Carla Huber   Chief Complaint  Patient presents with  . Coronary Artery Disease    SOME SOB, NO SWELLING, NO CHEST PAIN.   Problem List 1. CAD - coronary calcifications noted by CT scan 2. Palpitations 3.    History of Present Illness: Carla Huber is Huber 70 y.o. female who presents for further evaluation of some coronary calcifications noted on CT scan . Has had lots of asthma like issues / dyspnea with exertion. CT of the lungs was done for further eval.   Was noted to have coronary calcification in the LAD and RCA region.  Still very active.  Rare episode of CP  She fractured her sternum several years ago and she is not sure if that is what is causing her CP.  Both parents had heart issues. - father had Huber MI , mother had CHF   Past Medical History  Diagnosis Date  . Insomnia     Past Surgical History  Procedure Laterality Date  . Appendectomy  1970     Current Outpatient Prescriptions  Medication Sig Dispense Refill  . aspirin 325 MG tablet Take 175 mg by mouth daily.    Marland Kitchen. b complex vitamins tablet Take 1 tablet by mouth daily.    . budesonide-formoterol (SYMBICORT) 160-4.5 MCG/ACT inhaler Inhale 2 puffs into the lungs 2 (two) times daily. 1 Inhaler 6  . clonazePAM (KLONOPIN) 0.5 MG tablet Take 1 tablet (0.5 mg total) by mouth 2 (two) times daily. (Patient taking differently: Take 0.5 mg by mouth 2 (two) times daily as needed for anxiety. ) 60 tablet 2  . Fish Oil-Cholecalciferol (FISH OIL + D3) 1000-1000 MG-UNIT CAPS Take 1,000 mg by mouth daily.    Marland Kitchen. FLUoxetine (PROZAC) 20 MG capsule Take 20 mg by mouth daily.    . magnesium oxide (MAG-OX) 400 MG tablet Take 400 mg by mouth daily.    . montelukast (SINGULAIR) 10 MG tablet Take 10 mg by mouth at bedtime.    Marland Kitchen. PROAIR RESPICLICK 108 (90 Base) MCG/ACT AEPB  Inhale 2 puffs into the lungs every 4 (four) hours as needed. As needed for wheezing or sob    . traZODone (DESYREL) 50 MG tablet Take 50 mg by mouth daily. AT BEDTIME FOR SLEEP    . vitamin C (ASCORBIC ACID) 250 MG tablet Take 250 mg by mouth daily.     No current facility-administered medications for this visit.    Allergies:   Review of patient's allergies indicates no known allergies.    Social History:  The patient  reports that she has never smoked. She does not have any smokeless tobacco history on file. She reports that she drinks alcohol. She reports that she does not use illicit drugs.   Family History:  The patient's family history includes Heart attack in her father, maternal grandfather, maternal grandmother, paternal grandfather, and paternal grandmother; Heart failure in her mother; Lymphoma in her sister; Uterine cancer in her mother.    ROS:  Please see the history of present illness.    Review of Systems: Constitutional:  denies fever, chills, diaphoresis, appetite change and fatigue.  HEENT: denies photophobia, eye pain, redness, hearing loss, ear pain, congestion, sore throat, rhinorrhea, sneezing, neck pain, neck stiffness and tinnitus.  Respiratory: denies SOB, DOE, cough, chest tightness, and wheezing.  Cardiovascular: denies chest pain, palpitations and leg swelling.  Gastrointestinal: denies nausea, vomiting, abdominal pain, diarrhea, constipation, blood in stool.  Genitourinary: denies dysuria, urgency, frequency, hematuria, flank pain and difficulty urinating.  Musculoskeletal: denies  myalgias, back pain, joint swelling, arthralgias and gait problem.   Skin: denies pallor, rash and wound.  Neurological: denies dizziness, seizures, syncope, weakness, light-headedness, numbness and headaches.   Hematological: denies adenopathy, easy bruising, personal or family bleeding history.  Psychiatric/ Behavioral: denies suicidal ideation, mood changes, confusion,  nervousness, sleep disturbance and agitation.       All other systems are reviewed and negative.    PHYSICAL EXAM: VS:  BP 100/76 mmHg  Pulse 61  Ht 5' 5.5" (1.664 m)  Wt 147 lb (66.679 kg)  BMI 24.08 kg/m2 , BMI Body mass index is 24.08 kg/(m^2). GEN: Well nourished, well developed, in no acute distress HEENT: normal Neck: no JVD, carotid bruits, or masses Cardiac: RRR; no murmurs, rubs, or gallops,no edema  Respiratory:  clear to auscultation bilaterally, normal work of breathing GI: soft, nontender, nondistended, + BS MS: no deformity or atrophy Skin: warm and dry, no rash Neuro:  Strength and sensation are intact Psych: normal   EKG:  EKG is ordered today. The ekg ordered today demonstrates  NSR at 61.   Poor R wave progression    Recent Labs: No results found for requested labs within last 365 days.    Lipid Panel No results found for: CHOL, TRIG, HDL, CHOLHDL, VLDL, LDLCALC, LDLDIRECT    Wt Readings from Last 3 Encounters:  07/26/15 147 lb (66.679 kg)  03/28/15 148 lb 6.4 oz (67.314 kg)  02/21/15 145 lb (65.772 kg)      Other studies Reviewed: Additional studies/ records that were reviewed today include: . Review of the above records demonstrates:    ASSESSMENT AND PLAN:  1.  Coronary artery calcification - has mild Hyperlipidemia. Dr. Waynard Edwards had recommended that she start Huber statin the past but she did not start it because of some concerns for complications. Given the fact that she has coronary artery calcifications, I would recommend that we try to achieve an LDL level of 70 or below. We'll get Huber stress Myoview study for further evaluation. Assuming that the Myoview study is normal I will see her in  one year. I will see her sooner if she has an abnormal Myoview.  2. Essential HTN:   BP is mildly elevated. Does not eat much salt   Current medicines are reviewed at length with the patient today.  The patient does not have concerns regarding  medicines.  The following changes have been made:  no change  Labs/ tests ordered today include:  No orders of the defined types were placed in this encounter.     Disposition:   FU with me in 1 year      Carla Huber, Deloris Ping, Carla Huber  07/26/2015 3:44 PM    [facility] [address] [place], [place], [place]  [address] Phone: [phone]; Fax: [phone]   [facility]  [address] [place], [place]  [address] [phone]   Fax [phone]

## 2015-07-26 NOTE — Patient Instructions (Addendum)
Medication Instructions:  START Losartan 50 mg once daily   Labwork: Your physician recommends that you return for lab work in: 3 weeks for basic metabolic panel   Testing/Procedures: Your physician has requested that you have an exercise stress myoview. For further information please visit [email or web address]. Please follow instruction sheet, as given.    Follow-Up: Your physician wants you to follow-up in: 1 year with Dr. Elease HashimotoNahser.  You will receive a reminder letter in the mail two months in advance. If you don't receive a letter, please call our office to schedule the follow-up appointment.   If you need a refill on your cardiac medications before your next appointment, please call your pharmacy.   Thank you for choosing [facility]! Eligha BridegroomMichelle Izabella Marcantel, RN [phone]

## 2015-07-30 ENCOUNTER — Telehealth (HOSPITAL_COMMUNITY): Payer: Self-pay | Admitting: *Deleted

## 2015-07-30 NOTE — Telephone Encounter (Signed)
Left message on voicemail in reference to upcoming appointment scheduled for 08/01/15. Phone number given for a call back so details instructions can be given. Carla Huber W   

## 2015-08-01 ENCOUNTER — Ambulatory Visit (HOSPITAL_COMMUNITY): Payer: Medicare Other | Attending: Cardiology

## 2015-08-01 DIAGNOSIS — R9439 Abnormal result of other cardiovascular function study: Secondary | ICD-10-CM | POA: Insufficient documentation

## 2015-08-01 DIAGNOSIS — I2510 Atherosclerotic heart disease of native coronary artery without angina pectoris: Secondary | ICD-10-CM | POA: Insufficient documentation

## 2015-08-01 DIAGNOSIS — I2584 Coronary atherosclerosis due to calcified coronary lesion: Secondary | ICD-10-CM

## 2015-08-01 DIAGNOSIS — R0609 Other forms of dyspnea: Secondary | ICD-10-CM | POA: Insufficient documentation

## 2015-08-01 DIAGNOSIS — I10 Essential (primary) hypertension: Secondary | ICD-10-CM | POA: Insufficient documentation

## 2015-08-01 LAB — MYOCARDIAL PERFUSION IMAGING
CHL CUP MPHR: 152 {beats}/min
CHL CUP NUCLEAR SRS: 5
CHL CUP NUCLEAR SSS: 10
CSEPED: 6 min
CSEPPHR: 141 {beats}/min
Estimated workload: 7 METS
Exercise duration (sec): 0 s
LHR: 0.32
LVDIAVOL: 110 mL (ref 46–106)
LVSYSVOL: 52 mL
NUC STRESS TID: 1.1
Percent HR: 92 %
Rest HR: 53 {beats}/min
SDS: 5

## 2015-08-01 MED ORDER — TECHNETIUM TC 99M SESTAMIBI GENERIC - CARDIOLITE
10.7000 | Freq: Once | INTRAVENOUS | Status: AC | PRN
  Administered 2015-08-01: 11 via INTRAVENOUS

## 2015-08-01 MED ORDER — TECHNETIUM TC 99M SESTAMIBI GENERIC - CARDIOLITE
31.6000 | Freq: Once | INTRAVENOUS | Status: AC | PRN
  Administered 2015-08-01: 32 via INTRAVENOUS

## 2015-08-16 ENCOUNTER — Other Ambulatory Visit (INDEPENDENT_AMBULATORY_CARE_PROVIDER_SITE_OTHER): Payer: Medicare Other | Admitting: *Deleted

## 2015-08-16 DIAGNOSIS — I10 Essential (primary) hypertension: Secondary | ICD-10-CM

## 2015-08-16 LAB — BASIC METABOLIC PANEL
BUN: 11 mg/dL (ref 7–25)
CO2: 26 mmol/L (ref 20–31)
Calcium: 8.7 mg/dL (ref 8.6–10.4)
Chloride: 98 mmol/L (ref 98–110)
Creat: 0.99 mg/dL (ref 0.50–0.99)
Glucose, Bld: 87 mg/dL (ref 65–99)
POTASSIUM: 3.8 mmol/L (ref 3.5–5.3)
SODIUM: 130 mmol/L — AB (ref 135–146)

## 2015-08-16 NOTE — Addendum Note (Signed)
Addended by: Tonita PhoenixBOWDEN, ROBIN K on: 08/16/2015 08:10 AM   Modules accepted: Orders

## 2015-08-26 ENCOUNTER — Other Ambulatory Visit: Payer: Self-pay | Admitting: *Deleted

## 2015-08-26 MED ORDER — LOSARTAN POTASSIUM 50 MG PO TABS: 50.0000 mg | ORAL_TABLET | Freq: Every day | ORAL | Status: DC

## 2018-03-02 ENCOUNTER — Encounter: Payer: Self-pay | Admitting: Sports Medicine

## 2018-03-03 ENCOUNTER — Encounter: Payer: Self-pay | Admitting: Sports Medicine

## 2018-03-03 ENCOUNTER — Ambulatory Visit: Payer: Medicare Other | Admitting: Sports Medicine

## 2018-03-03 VITALS — BP 153/91 | Ht 65.0 in | Wt 150.0 lb

## 2018-03-03 DIAGNOSIS — M25561 Pain in right knee: Secondary | ICD-10-CM | POA: Diagnosis not present

## 2018-03-03 DIAGNOSIS — R0789 Other chest pain: Secondary | ICD-10-CM

## 2018-03-03 DIAGNOSIS — R0781 Pleurodynia: Secondary | ICD-10-CM | POA: Diagnosis not present

## 2018-03-03 MED ORDER — PREDNISONE 10 MG PO TABS: ORAL_TABLET | ORAL | 0 refills | Status: DC

## 2018-03-03 NOTE — Patient Instructions (Addendum)
The knee pain you are having is most likely combination of a mild prepatellar bursitis as well as a bone bruise of the distal femur. - Ice 15 minutes 3-4 times per day - steroid Dosepak - After the steroids completely, you may use ibuprofen or Aleve as needed for pain - May continue activity as tolerated  The rib pain you are having is likely due to a strain of the intercostal muscles - Consider trying salonpas patches for local pain relief  We will have you follow-up in 4 weeks

## 2018-03-03 NOTE — Progress Notes (Signed)
  Carla Huber - 72 y.o. female MRN [id number]  Date of birth: 1945-06-01    SUBJECTIVE:      Chief Complaint:/ HPI:  72 year old female with right knee and right rib pain. Patient states that on 14 October she was walking her dog and was pulled forward by the leash.  She fell onto her right knee and right side with her arm extended. Regarding her right ribs, she reports 4/10 constant pain.  Is worse with deep inspiration or lying on her right side or stomach.  She does not use any medication for this.  She denies any cough or increased sputum production.  No fevers or chills.  No dyspnea.  Her right knee pain is also 4/10 at baseline and can be 7/10.  Pain is located anteriorly around the patella.  Her pain is exacerbated by squatting or forcing to full knee extension, or walking downstairs.  No significant pain with ambulation weightbearing.  She does not take any medication for this.  She denies any joint swelling.  No erythema or bruising.  No locking or giving out however at times it does feel slightly unstable.  No associated skin changes  ROS:     See HPI  PERTINENT  PMH / PSH FH / / SH:  Past Medical, Surgical, Social, and Family History Reviewed & Updated in the EMR.     OBJECTIVE: BP (!) 153/91   Ht 5\' 5"  (1.651 m)   Wt 150 lb (68 kg)   BMI 24.96 kg/m   Physical Exam:  Vital signs are reviewed.  GEN: Alert and oriented, NAD Pulm: Breathing unlabored PSY: normal mood, congruent affect  MSK: Ribs: Patient has diffuse tenderness palpation over the area of ribs 8-10 laterally.  No crepitus appreciated.  Right knee: - Inspection: no gross deformity.  No prepatellar swelling.  No erythema - Palpation: Tenderness directly over the patella.  Tenderness also over the medial and lateral patellofemoral joints.  Tenderness over the medial femoral condyle - ROM: full active ROM with flexion and extension in knee.  Pain around the medial patellofemoral joint with full knee  extension - Strength: 5/5 strength - Neuro/vasc: NV intact - Special Tests: - LIGAMENTS: negative anterior and posterior drawer, negative Lachman's, no MCL or LCL laxity  -- MENISCUS: negative McMurray's -- PF JOINT: nml patellar mobility without apprehension  Left knee: No obvious deformity.  No swelling noted No tenderness palpation Full range of motion with 5/5 strength Neurovascularly intact distally   ASSESSMENT & PLAN:  1.  Acute right knee pain- patient knee pain is most consistent with a contusion given her tenderness around the patella as well as the medial femoral condyle.  There is also a component of mild prepatellar bursitis. - We will start her on a steroid Dosepak - May use NSAIDs or Tylenol after completing the Dosepak -Ice 15 minutes 3-4 times per day - Continue activity as tolerated -She will follow-up in 4 weeks   2.  Right rib pain- x-ray report available from October 15, the day following her injury.  This shows no acute rib fractures.  Based on the diffuse nature of her pain, I suspect it is due to intercostal muscle strain +/-soft tissue bruising - Steroid Dosepak should help with this pain as well. - Also recommended topical lidocaine patches for symptomatic relief  I was the preceptor for this visit and available for immediate consultation. Reino Bellis, DO

## 2018-06-16 ENCOUNTER — Other Ambulatory Visit: Payer: Self-pay | Admitting: Internal Medicine

## 2018-06-16 DIAGNOSIS — Z1231 Encounter for screening mammogram for malignant neoplasm of breast: Secondary | ICD-10-CM

## 2018-07-11 ENCOUNTER — Other Ambulatory Visit: Payer: Self-pay

## 2018-07-11 ENCOUNTER — Ambulatory Visit
Admission: RE | Admit: 2018-07-11 | Discharge: 2018-07-11 | Disposition: A | Discharge status: Home / Self Care | Payer: Medicare Other | Source: Ambulatory Visit | Attending: Internal Medicine | Admitting: Internal Medicine

## 2018-07-11 DIAGNOSIS — Z1231 Encounter for screening mammogram for malignant neoplasm of breast: Secondary | ICD-10-CM

## 2019-09-18 ENCOUNTER — Encounter: Payer: Self-pay | Admitting: Cardiovascular Disease

## 2019-09-18 NOTE — Progress Notes (Signed)
Cardiology Office Note   Date:  09/18/2019   ID:  Carla Huber, DOB 07-Dec-1945, MRN [id number]  PCP:  Rodrigo Ran, MD  Cardiologist:   Kristeen Miss, MD   Chief Complaint  Patient presents with  . Coronary Artery Disease   Problem List 1. CAD - coronary calcifications noted by CT scan 2. Palpitations 3.    History of Present Illness: 07/26/2015  Carla Huber is a 74 y.o. female who presents for further evaluation of some coronary calcifications noted on CT scan . Has had lots of asthma like issues / dyspnea with exertion. CT of the lungs was done for further eval.   Was noted to have coronary calcification in the LAD and RCA region.  Still very active.  Rare episode of CP  She fractured her sternum several years ago and she is not sure if that is what is causing her CP.  Both parents had heart issues. - father had a MI , mother had CHF  Sep 19, 2019:  Carla Huber is seen after a 4 year absence  She has a hx of coronary calcifications  She has been having some dyspnea  And upper left chest tightness.  Can occur spontanusly . 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  She notes that all of the symptoms have improved slightly since she had her blood pressure medications increased.  Her blood pressure is still a little bit elevated. Still eats some salty foods.   No syncope or presyncope   Has a history of hyperlipidemia.  She was previously on Crestor 5 mg a day but started having leg cramps.  She stopped her Crestor at this time.  Past Medical History:  Diagnosis Date  . Coronary artery calcification 07/26/2015  . Cough 02/21/2015  . Dyspnea 02/21/2015  . Exposure to respiratory irritant 02/21/2015  . Insomnia   . Palpitations 05/09/2014    Past Surgical History:  Procedure Laterality Date  . APPENDECTOMY  1970     Current Outpatient Medications  Medication Sig Dispense Refill  .  aspirin 325 MG tablet Take 175 mg by mouth daily.    Marland Kitchen b complex vitamins tablet Take 1 tablet by mouth daily.    . budesonide-formoterol (SYMBICORT) 160-4.5 MCG/ACT inhaler Inhale 2 puffs into the lungs 2 (two) times daily. 1 Inhaler 6  . Fish Oil-Cholecalciferol (FISH OIL + D3) 1000-1000 MG-UNIT CAPS Take 1,000 mg by mouth daily.    Marland Kitchen FLUoxetine (PROZAC) 20 MG capsule Take 20 mg by mouth daily.    . magnesium oxide (MAG-OX) 400 MG tablet Take 400 mg by mouth daily.    . predniSONE (DELTASONE) 10 MG tablet Take as directed per MD instructions 21 tablet 0  . PROAIR RESPICLICK 108 (90 Base) MCG/ACT AEPB Inhale 2 puffs into the lungs every 4 (four) hours as needed. As needed for wheezing or sob    . traZODone (DESYREL) 50 MG tablet Take 50 mg by mouth daily. AT BEDTIME FOR SLEEP    . vitamin C (ASCORBIC ACID) 250 MG tablet Take 250 mg by mouth daily.     No current facility-administered medications for this visit.    Allergies:   Patient has no known allergies.    Social History:  The patient  reports that she has never smoked. She does not have any smokeless tobacco history on file. She reports current alcohol use.  She reports that she does not use drugs.   Family History:  The patient's family history includes Breast cancer in her paternal aunt; Heart attack in her father, maternal grandfather, maternal grandmother, paternal grandfather, and paternal grandmother; Heart failure in her mother; Lymphoma in her sister; Uterine cancer in her mother.    ROS:  Please see the history of present illness.     All other systems are reviewed and negative.   Physical Exam: There were no vitals taken for this visit.  GEN:  Well nourished, well developed in no acute distress HEENT: Normal NECK: No JVD; No carotid bruits LYMPHATICS: No lymphadenopathy CARDIAC: RRR , no murmurs, rubs, gallops RESPIRATORY:  Clear to auscultation without rales, wheezing or rhonchi  ABDOMEN: Soft, non-tender,  non-distended MUSCULOSKELETAL:  No edema; No deformity  SKIN: Warm and dry NEUROLOGIC:  Alert and oriented x 3   EKG:   Sep 19, 2019: Normal sinus rhythm at 61.  Early repolarization changes.  No acute changes.    Recent Labs: No results found for requested labs within last 8760 hours.    Lipid Panel No results found for: CHOL, TRIG, HDL, CHOLHDL, VLDL, LDLCALC, LDLDIRECT    Wt Readings from Last 3 Encounters:  03/03/18 150 lb (68 kg)  08/01/15 147 lb (66.7 kg)  07/26/15 147 lb (66.7 kg)      Other studies Reviewed: Additional studies/ records that were reviewed today include: . Review of the above records demonstrates:    ASSESSMENT AND PLAN:  1.  Coronary artery calcification She is now started having some episodes of chest tightness.  These occur with exertion when she is carrying objects or walking up a hill.  I am concerned that she may have progression of her coronary artery disease.  I would like to get a coronary CT angiogram for further evaluation.  We will prescribe her nitroglycerin to take on an as-needed basis..    2. Essential HTN:    Blood pressure remains mildly elevated.  We will add HCTZ 25 mg a day and potassium chloride 10 mill equivalents a day.  3.  Hyperlipidemia: Her LDL is 123.  She did not tolerate Crestor due to muscle aches and leg weakness.  We will refer her to the lipid clinic for consideration of PCSK9 inhibitors.   I will see her in 3 months.  Current medicines are reviewed at length with the patient today.  The patient does not have concerns regarding medicines.  The following changes have been made:  no change  Labs/ tests ordered today include:  No orders of the defined types were placed in this encounter.    Disposition:      Mertie Moores, MD  09/18/2019 9:23 PM    [place], [place], [place]  [address] Phone: [phone]; Fax: [phone]   [facility]  [address] [place], [place]  [address] [phone]   Fax [phone]

## 2019-09-19 ENCOUNTER — Encounter: Payer: Self-pay | Admitting: Cardiovascular Disease

## 2019-09-19 ENCOUNTER — Other Ambulatory Visit: Payer: Self-pay

## 2019-09-19 ENCOUNTER — Ambulatory Visit: Payer: Medicare PPO | Admitting: Cardiovascular Disease

## 2019-09-19 VITALS — BP 148/86 | HR 60 | Ht 65.0 in | Wt 153.4 lb

## 2019-09-19 DIAGNOSIS — I2584 Coronary atherosclerosis due to calcified coronary lesion: Secondary | ICD-10-CM

## 2019-09-19 DIAGNOSIS — R0789 Other chest pain: Secondary | ICD-10-CM | POA: Diagnosis not present

## 2019-09-19 DIAGNOSIS — I10 Essential (primary) hypertension: Secondary | ICD-10-CM

## 2019-09-19 DIAGNOSIS — E782 Mixed hyperlipidemia: Secondary | ICD-10-CM

## 2019-09-19 DIAGNOSIS — E785 Hyperlipidemia, unspecified: Secondary | ICD-10-CM | POA: Diagnosis not present

## 2019-09-19 DIAGNOSIS — I2510 Atherosclerotic heart disease of native coronary artery without angina pectoris: Secondary | ICD-10-CM | POA: Diagnosis not present

## 2019-09-19 MED ORDER — POTASSIUM CHLORIDE CRYS ER 20 MEQ PO TBCR: 20.0000 meq | EXTENDED_RELEASE_TABLET | Freq: Every day | ORAL | 11 refills | Status: DC

## 2019-09-19 MED ORDER — NITROGLYCERIN 0.4 MG SL SUBL: 0.4000 mg | SUBLINGUAL_TABLET | SUBLINGUAL | 6 refills | Status: AC | PRN

## 2019-09-19 MED ORDER — ASPIRIN EC 81 MG PO TBEC: 81.0000 mg | DELAYED_RELEASE_TABLET | Freq: Every day | ORAL | Status: AC

## 2019-09-19 MED ORDER — METOPROLOL TARTRATE 50 MG PO TABS: ORAL_TABLET | ORAL | 0 refills | Status: DC

## 2019-09-19 MED ORDER — HYDROCHLOROTHIAZIDE 25 MG PO TABS: 25.0000 mg | ORAL_TABLET | Freq: Every day | ORAL | 11 refills | Status: DC

## 2019-09-19 NOTE — Patient Instructions (Addendum)
Medication Instructions:  Your physician has recommended you make the following change in your medication:  DECREASE Aspirin to 81 mg once daily START HCTZ (Hydrochlorothiazide) 25 mg once daily in the AM START Kdur (Potassium chloride) 10 mEq once daily USE Nitroglycerin 0.4 mg under your tongue as needed for chest tightness   *If you need a refill on your cardiac medications before your next appointment, please call your pharmacy*   Lab Work: Your physician recommends that you return for lab work in: 1 week before your coronary CT  If you have labs (blood work) drawn today and your tests are completely normal, you will receive your results only by: Marland Kitchen MyChart Message (if you have MyChart) OR . A paper copy in the mail If you have any lab test that is abnormal or we need to change your treatment, we will call you to review the results.   Testing/Procedures: Your cardiac CT will be scheduled at one of the below locations:   [facility] [address] [place], [place] [address] [phone]  [place] [address] [place], [place] [address] [phone]  If scheduled at [facility], please arrive at the [facility] main entrance of [facility] 30 minutes prior to test start time. Proceed to the [facility] Radiology Department (first floor) to check-in and test prep.  If scheduled at [facility], please arrive 15 mins early for check-in and test prep.  Please follow these instructions carefully (unless otherwise directed):  Hold all erectile dysfunction medications at least 3 days (72 hrs) prior to test.  On the Night Before the Test: . Be sure to Drink plenty of water. . Do not consume any caffeinated/decaffeinated beverages or chocolate 12 hours prior to your test. . Do not take any antihistamines 12 hours prior to your test. . If you take Metformin do not take 24  hours prior to test. . If the patient has contrast allergy: ? Patient will need a prescription for Prednisone and very clear instructions (as follows): 1. Prednisone 50 mg - take 13 hours prior to test 2. Take another Prednisone 50 mg 7 hours prior to test 3. Take another Prednisone 50 mg 1 hour prior to test 4. Take Benadryl 50 mg 1 hour prior to test . Patient must complete all four doses of above prophylactic medications. . Patient will need a ride after test due to Benadryl.  On the Day of the Test: . Drink plenty of water. Do not drink any water within one hour of the test. . Do not eat any food 4 hours prior to the test. . You may take your regular medications prior to the test.  . Take metoprolol (Lopressor) two hours prior to test. . HOLD Furosemide/Hydrochlorothiazide morning of the test. . FEMALES- please wear underwire-free bra if available       After the Test: . Drink plenty of water. . After receiving IV contrast, you may experience a mild flushed feeling. This is normal. . On occasion, you may experience a mild rash up to 24 hours after the test. This is not dangerous. If this occurs, you can take Benadryl 25 mg and increase your fluid intake. . If you experience trouble breathing, this can be serious. If it is severe call 911 IMMEDIATELY. If it is mild, please call our office. . If you take any of these medications: Glipizide/Metformin, Avandament, Glucavance, please do not take 48 hours  after completing test unless otherwise instructed.   Once we have confirmed authorization from your insurance company, we will call you to set up a date and time for your test.   For non-scheduling related questions, please contact the cardiac imaging nurse navigator should you have any questions/concerns: Marchia Bond, Cardiac Imaging Nurse Navigator Burley Saver, Interim Cardiac Imaging Nurse [place] [facility] Direct Office Dial: [phone]   For  scheduling needs, including cancellations and rescheduling, please call [phone].      Follow-Up: At [facility], you and your health needs are our priority.  As part of our continuing mission to provide you with exceptional heart care, we have created designated Provider Care Teams.  These Care Teams include your primary Cardiologist (physician) and Advanced Practice Providers (APPs -  Physician Assistants and Nurse Practitioners) who all work together to provide you with the care you need, when you need it.   Your next appointment:   3 month(s)  The format for your next appointment:   In Person  Provider:   You may see Mertie Moores, MD or one of the following Advanced Practice Providers on your designated Care Team:    Richardson Dopp, PA-C  Vin Rachel, [place]    Other Instructions You have been referred to [facility] for evaluation of treatment for your high cholesterol

## 2019-09-20 ENCOUNTER — Other Ambulatory Visit: Payer: Self-pay | Admitting: Internal Medicine

## 2019-09-20 DIAGNOSIS — Z1231 Encounter for screening mammogram for malignant neoplasm of breast: Secondary | ICD-10-CM

## 2019-09-21 ENCOUNTER — Ambulatory Visit
Admission: RE | Admit: 2019-09-21 | Discharge: 2019-09-21 | Disposition: A | Discharge status: Home / Self Care | Payer: Medicare PPO | Source: Ambulatory Visit | Attending: Internal Medicine | Admitting: Internal Medicine

## 2019-09-21 DIAGNOSIS — Z1231 Encounter for screening mammogram for malignant neoplasm of breast: Secondary | ICD-10-CM

## 2019-09-27 DIAGNOSIS — M5136 Other intervertebral disc degeneration, lumbar region: Secondary | ICD-10-CM | POA: Diagnosis not present

## 2019-09-27 DIAGNOSIS — M9903 Segmental and somatic dysfunction of lumbar region: Secondary | ICD-10-CM | POA: Diagnosis not present

## 2019-09-27 DIAGNOSIS — M62830 Muscle spasm of back: Secondary | ICD-10-CM | POA: Diagnosis not present

## 2019-09-27 DIAGNOSIS — M5432 Sciatica, left side: Secondary | ICD-10-CM | POA: Diagnosis not present

## 2019-09-28 DIAGNOSIS — M9903 Segmental and somatic dysfunction of lumbar region: Secondary | ICD-10-CM | POA: Diagnosis not present

## 2019-09-28 DIAGNOSIS — M5432 Sciatica, left side: Secondary | ICD-10-CM | POA: Diagnosis not present

## 2019-09-28 DIAGNOSIS — M62830 Muscle spasm of back: Secondary | ICD-10-CM | POA: Diagnosis not present

## 2019-09-28 DIAGNOSIS — M5136 Other intervertebral disc degeneration, lumbar region: Secondary | ICD-10-CM | POA: Diagnosis not present

## 2019-10-02 ENCOUNTER — Other Ambulatory Visit: Payer: Self-pay

## 2019-10-02 ENCOUNTER — Ambulatory Visit (INDEPENDENT_AMBULATORY_CARE_PROVIDER_SITE_OTHER): Payer: Medicare PPO | Admitting: Pharmacist

## 2019-10-02 DIAGNOSIS — M62830 Muscle spasm of back: Secondary | ICD-10-CM | POA: Diagnosis not present

## 2019-10-02 DIAGNOSIS — E785 Hyperlipidemia, unspecified: Secondary | ICD-10-CM

## 2019-10-02 DIAGNOSIS — M5432 Sciatica, left side: Secondary | ICD-10-CM | POA: Diagnosis not present

## 2019-10-02 DIAGNOSIS — M9903 Segmental and somatic dysfunction of lumbar region: Secondary | ICD-10-CM | POA: Diagnosis not present

## 2019-10-02 DIAGNOSIS — M5136 Other intervertebral disc degeneration, lumbar region: Secondary | ICD-10-CM | POA: Diagnosis not present

## 2019-10-02 MED ORDER — ATORVASTATIN CALCIUM 20 MG PO TABS: 20.0000 mg | ORAL_TABLET | Freq: Every day | ORAL | 0 refills | Status: DC

## 2019-10-02 NOTE — Progress Notes (Signed)
Patient ID: NEIRA BENTSEN                 DOB: [birth year]                    MRN: [id number]     HPI: Carla Huber is a 74 y.o. female patient referred to lipid clinic by Dr. Elease Hashimoto. PMH is significant for CAD with some CP/SOB with exertion.  Carla Huber presents to the lipid clinic for the first time today. She reports she is currently not taking any lipid lowering medications but has been told to start one in the past. Recently she trialed rosuvastatin 5 mg but developed severe leg cramps that resolved once she stopped the medication. 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 Her diet is mostly well controlled but is significant for french fries when she dines out as well as daily snacks including some meats and cheeses.   She reports some hesitancy towards statins due to her reaction to rosuvastatin as well as her husbands reaction to simvastatin. She is understanding of the importance of statins and the benefit of LDL reduction.   Current Medications: none Intolerances: rosuvastatin 5 mg (leg cramps) Risk Factors: family history, coronary calcifications LDL goal: <70  Diet: french fries when eats out ~ once every other week. Breakfast is fruit, yogurt with fiber, sometimes an egg. lunch is typically a snack of ham and cheese, crackers with cheese, or a banana with peanut butter. Dinner is always home cooked without processed foods and limited in carbs.  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 ALT 17, AST  25  Past Medical History:  Diagnosis Date  . Coronary artery calcification 07/26/2015  . Cough 02/21/2015  . Dyspnea 02/21/2015  . Exposure to respiratory irritant 02/21/2015  . Insomnia   . Palpitations 05/09/2014    Current Outpatient Medications on File Prior to Visit  Medication Sig Dispense Refill  . aspirin EC 81 MG tablet Take 1 tablet (81 mg total) by mouth daily.    Marland Kitchen b complex vitamins tablet Take 1 tablet by mouth daily.    . Fish Oil-Cholecalciferol (FISH OIL + D3) 1000-1000 MG-UNIT CAPS Take 1,000 mg by mouth daily.    . hydrochlorothiazide (HYDRODIURIL) 25 MG tablet Take 1 tablet (25 mg total) by mouth daily. 30 tablet 11  . magnesium oxide (MAG-OX) 400 MG tablet Take 400 mg by mouth daily.    . metoprolol tartrate (LOPRESSOR) 50 MG tablet At 2 hours before test, check your pulse. Take 1 tablet (50 mg) if pulse is less than 60 beats/minute. Take 2 tabs (100 mg) if pulse is greater than 65 beats per minute) 2 tablet 0  . nitroGLYCERIN (NITROSTAT) 0.4 MG SL tablet Place 1 tablet (0.4 mg total) under the tongue every 5 (five) minutes as needed for chest pain. 25 tablet 6  . potassium chloride SA (KLOR-CON) 20 MEQ tablet Take 1 tablet (20 mEq total) by mouth daily. 30 tablet 11  . rosuvastatin (CRESTOR) 5  MG tablet Take 1 tablet by mouth daily.    Marland Kitchen telmisartan (MICARDIS) 40 MG tablet Take 1 tablet by mouth daily.    . traZODone (DESYREL) 100 MG tablet Take 1 tablet by mouth daily.    . vitamin C (ASCORBIC ACID) 250 MG tablet Take 250 mg by mouth daily.    Marland Kitchen VITAMIN D PO Take 1,000 mg by mouth daily.     No current facility-administered medications on file prior to visit.    No Known Allergies  Assessment/Plan:  1. Hyperlipidemia - Uncontrolled LDL of 123 (goal <70) in patient with coronary calcifications seen on CT scan. After education on benefits of statins, answering all questions regarding statin benefits, and addressing concerns regarding myalgias, cognitive  dysfunction, and other side effects, she is willing to trial atorvastatin 20 mg once daily. Continue active lifestyle. Continue current diet with efforts to decrease french fries, meat, and cheese consumption. She was educated to call the clinic if any intolerances develop and we can consider using pravastatin 40 mg daily VS atorvastatin 20 mg every other day. Will follow up with lipid panel on 11/27/2019.    Eddie Candle, PharmD PGY-1 Pharmacy Resident

## 2019-10-02 NOTE — Patient Instructions (Addendum)
It was great meeting you today!   Your LDL goal is <70. Your last LDL was 123 in April.   Start taking atorvastatin 20 mg every day. Continue your active lifestyle and current diet. Try decreasing meats and cheeses as able.   Call us if you develop any muscle aches or have any questions at [phone].   We will follow up an LDL level on 11/27/2019.

## 2019-10-05 DIAGNOSIS — M62830 Muscle spasm of back: Secondary | ICD-10-CM | POA: Diagnosis not present

## 2019-10-05 DIAGNOSIS — M5136 Other intervertebral disc degeneration, lumbar region: Secondary | ICD-10-CM | POA: Diagnosis not present

## 2019-10-05 DIAGNOSIS — M5432 Sciatica, left side: Secondary | ICD-10-CM | POA: Diagnosis not present

## 2019-10-05 DIAGNOSIS — M9903 Segmental and somatic dysfunction of lumbar region: Secondary | ICD-10-CM | POA: Diagnosis not present

## 2019-10-18 ENCOUNTER — Other Ambulatory Visit: Payer: Self-pay

## 2019-10-18 ENCOUNTER — Other Ambulatory Visit: Payer: Medicare PPO | Admitting: *Deleted

## 2019-10-18 ENCOUNTER — Telehealth (HOSPITAL_COMMUNITY): Payer: Self-pay | Admitting: *Deleted

## 2019-10-18 DIAGNOSIS — M5136 Other intervertebral disc degeneration, lumbar region: Secondary | ICD-10-CM | POA: Diagnosis not present

## 2019-10-18 DIAGNOSIS — I10 Essential (primary) hypertension: Secondary | ICD-10-CM | POA: Diagnosis not present

## 2019-10-18 DIAGNOSIS — M9903 Segmental and somatic dysfunction of lumbar region: Secondary | ICD-10-CM | POA: Diagnosis not present

## 2019-10-18 DIAGNOSIS — E785 Hyperlipidemia, unspecified: Secondary | ICD-10-CM | POA: Diagnosis not present

## 2019-10-18 DIAGNOSIS — M62830 Muscle spasm of back: Secondary | ICD-10-CM | POA: Diagnosis not present

## 2019-10-18 DIAGNOSIS — M5432 Sciatica, left side: Secondary | ICD-10-CM | POA: Diagnosis not present

## 2019-10-18 DIAGNOSIS — R0789 Other chest pain: Secondary | ICD-10-CM

## 2019-10-18 LAB — BASIC METABOLIC PANEL
BUN/Creatinine Ratio: 18 (ref 12–28)
BUN: 15 mg/dL (ref 8–27)
CO2: 21 mmol/L (ref 20–29)
Calcium: 9.5 mg/dL (ref 8.7–10.3)
Chloride: 94 mmol/L — ABNORMAL LOW (ref 96–106)
Creatinine, Ser: 0.83 mg/dL (ref 0.57–1.00)
GFR calc Af Amer: 81 mL/min/{1.73_m2} (ref >59)
GFR calc non Af Amer: 70 mL/min/{1.73_m2} (ref >59)
Glucose: 90 mg/dL (ref 65–99)
Potassium: 4.2 mmol/L (ref 3.5–5.2)
Sodium: 129 mmol/L — ABNORMAL LOW (ref 134–144)

## 2019-10-18 NOTE — Telephone Encounter (Signed)
Pt returning call regarding upcoming cardiac imaging study; pt verbalizes understanding of appt date/time, parking situation and where to check in, pre-test NPO status and medications ordered, and verified current allergies; name and call back number provided for further questions should they arise  Damiah Mcdonald Tai RN Navigator Cardiac Imaging  [facility] [phone] office [phone] cell  

## 2019-10-18 NOTE — Telephone Encounter (Signed)
Attempted to call patient regarding upcoming cardiac CT appointment. °Left message on voicemail with name and callback number ° °Natavia Sublette Tai RN Navigator Cardiac Imaging °[facility] °[phone] Office °[phone] Cell ° °

## 2019-10-19 ENCOUNTER — Telehealth: Payer: Self-pay | Admitting: *Deleted

## 2019-10-19 DIAGNOSIS — Z79899 Other long term (current) drug therapy: Secondary | ICD-10-CM

## 2019-10-19 DIAGNOSIS — I10 Essential (primary) hypertension: Secondary | ICD-10-CM

## 2019-10-19 MED ORDER — TELMISARTAN 80 MG PO TABS: 80.0000 mg | ORAL_TABLET | Freq: Every day | ORAL | 1 refills | Status: DC

## 2019-10-19 NOTE — Addendum Note (Signed)
Addended by: Loa Socks on: 10/19/2019 11:52 AM   Modules accepted: Orders

## 2019-10-19 NOTE — Telephone Encounter (Signed)
Called the pt and informed her of lab results and recommendations per Dr. Elease Hashimoto.  Pt verbalized understanding and agrees with this plan.  Pt did however, want to run a couple issues by Dr. Elease Hashimoto, that she is experiencing with taking HCTZ.  Pt states since taking this med, she has been feeling slightly lightheaded and her vision with seeing objects from a distance, is blurry.  She voiced no other complaints. She said both issues started when she began this regimen. With that, she then did some research on HCTZ and side effects, and also noted that it states to avoid prolong exposure to sunlight while taking this medication.  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 Pt would like for Dr. Elease Hashimoto to further review and advise on issues she is experiencing with this medication, and advise on an alternative diuretic, if possible.  Informed the pt that Dr. Elease Hashimoto is out of the office today, but I will route this message to him and our Pharmacist, to further advise on this matter.  Informed the pt that a nurse from the office will follow-up with her shortly thereafter, once further recommendations are provided.  Pt verbalized understanding and agrees with this plan.

## 2019-10-19 NOTE — Telephone Encounter (Signed)
Photosensitivity is listed as a possible side effect.  I'm not sure that I see if with any regularity . We can DC HCTZ and Kdur and increase Telmisartan to 80 mg a day . Check BMP in 2-3 weeks.

## 2019-10-19 NOTE — Telephone Encounter (Signed)
Spoke with the Carla Huber and endorsed to her recommendations per Dr. Elease Hashimoto, that we can D/C her HCTZ and KDUR, and increase her Telmisartan to 80 mg po daily, and come into the office for labs in 2-3 weeks, to recheck BMET.  Carla Huber states she would love to proceed with these recommendations. Confirmed the pharmacy of choice with the Carla Huber.  Scheduled the Carla Huber to come in for lab to recheck BMET on 11/09/19.  Advised her to stop KDUR and HCTZ.  Updated HCTZ as an intolerance in her allergies, causing her photosensitivity.  Carla Huber verbalized understanding and agrees with this plan.

## 2019-10-19 NOTE — Telephone Encounter (Signed)
-----   Message from Vesta Mixer, MD sent at 10/19/2019 10:18 AM EDT ----- Sodium is slightly low.   Make sure she is not drinking excessive water. Cont current meds.

## 2019-10-20 ENCOUNTER — Ambulatory Visit (HOSPITAL_COMMUNITY)
Admission: RE | Admit: 2019-10-20 | Discharge: 2019-10-20 | Disposition: A | Discharge status: Home / Self Care | Payer: Medicare PPO | Source: Ambulatory Visit | Attending: Cardiology | Admitting: Cardiology

## 2019-10-20 ENCOUNTER — Other Ambulatory Visit: Payer: Self-pay

## 2019-10-20 ENCOUNTER — Ambulatory Visit (HOSPITAL_COMMUNITY)
Admission: RE | Admit: 2019-10-20 | Discharge: 2019-10-20 | Disposition: A | Discharge status: Home / Self Care | Payer: Medicare PPO | Source: Ambulatory Visit | Attending: Cardiovascular Disease | Admitting: Cardiovascular Disease

## 2019-10-20 ENCOUNTER — Other Ambulatory Visit: Payer: Self-pay | Admitting: Cardiology

## 2019-10-20 DIAGNOSIS — R079 Chest pain, unspecified: Secondary | ICD-10-CM | POA: Diagnosis not present

## 2019-10-20 DIAGNOSIS — I700 Atherosclerosis of aorta: Secondary | ICD-10-CM | POA: Diagnosis not present

## 2019-10-20 DIAGNOSIS — I2510 Atherosclerotic heart disease of native coronary artery without angina pectoris: Secondary | ICD-10-CM | POA: Diagnosis not present

## 2019-10-20 DIAGNOSIS — R0789 Other chest pain: Secondary | ICD-10-CM | POA: Insufficient documentation

## 2019-10-20 MED ORDER — IOHEXOL 350 MG/ML SOLN
80.0000 mL | Freq: Once | INTRAVENOUS | Status: AC | PRN
  Administered 2019-10-20: 80 mL via INTRAVENOUS

## 2019-10-20 MED ORDER — NITROGLYCERIN 0.4 MG SL SUBL
0.8000 mg | SUBLINGUAL_TABLET | Freq: Once | SUBLINGUAL | Status: AC
  Administered 2019-10-20: 0.8 mg via SUBLINGUAL

## 2019-10-20 MED ORDER — NITROGLYCERIN 0.4 MG SL SUBL
SUBLINGUAL_TABLET | SUBLINGUAL | Status: AC
  Filled 2019-10-20: qty 2

## 2019-10-20 NOTE — Progress Notes (Signed)
Patient tolerated CT well. Drank a soda and ate chips after. Ambulatory to exit steady gait.

## 2019-10-21 DIAGNOSIS — R079 Chest pain, unspecified: Secondary | ICD-10-CM | POA: Diagnosis not present

## 2019-10-25 ENCOUNTER — Telehealth: Payer: Self-pay | Admitting: Cardiovascular Disease

## 2019-10-25 NOTE — Telephone Encounter (Signed)
    Pt is returning call from Okey Regal to get CT results

## 2019-10-25 NOTE — Telephone Encounter (Signed)
See CT results for  notes 

## 2019-11-01 ENCOUNTER — Other Ambulatory Visit: Payer: Self-pay | Admitting: Cardiovascular Disease

## 2019-11-01 DIAGNOSIS — M5432 Sciatica, left side: Secondary | ICD-10-CM | POA: Diagnosis not present

## 2019-11-01 DIAGNOSIS — E785 Hyperlipidemia, unspecified: Secondary | ICD-10-CM

## 2019-11-01 DIAGNOSIS — M5136 Other intervertebral disc degeneration, lumbar region: Secondary | ICD-10-CM | POA: Diagnosis not present

## 2019-11-01 DIAGNOSIS — M9903 Segmental and somatic dysfunction of lumbar region: Secondary | ICD-10-CM | POA: Diagnosis not present

## 2019-11-01 DIAGNOSIS — M62830 Muscle spasm of back: Secondary | ICD-10-CM | POA: Diagnosis not present

## 2019-11-02 ENCOUNTER — Other Ambulatory Visit: Payer: Self-pay | Admitting: *Deleted

## 2019-11-02 DIAGNOSIS — E785 Hyperlipidemia, unspecified: Secondary | ICD-10-CM

## 2019-11-02 MED ORDER — ATORVASTATIN CALCIUM 20 MG PO TABS: 20.0000 mg | ORAL_TABLET | Freq: Every day | ORAL | 3 refills | Status: DC

## 2019-11-09 ENCOUNTER — Other Ambulatory Visit: Payer: Medicare PPO

## 2019-11-16 ENCOUNTER — Other Ambulatory Visit: Payer: Self-pay

## 2019-11-16 ENCOUNTER — Other Ambulatory Visit: Payer: Medicare PPO | Admitting: *Deleted

## 2019-11-16 DIAGNOSIS — I10 Essential (primary) hypertension: Secondary | ICD-10-CM

## 2019-11-16 DIAGNOSIS — Z79899 Other long term (current) drug therapy: Secondary | ICD-10-CM | POA: Diagnosis not present

## 2019-11-16 DIAGNOSIS — M5432 Sciatica, left side: Secondary | ICD-10-CM | POA: Diagnosis not present

## 2019-11-16 DIAGNOSIS — M5136 Other intervertebral disc degeneration, lumbar region: Secondary | ICD-10-CM | POA: Diagnosis not present

## 2019-11-16 DIAGNOSIS — M9903 Segmental and somatic dysfunction of lumbar region: Secondary | ICD-10-CM | POA: Diagnosis not present

## 2019-11-16 DIAGNOSIS — M62830 Muscle spasm of back: Secondary | ICD-10-CM | POA: Diagnosis not present

## 2019-11-16 LAB — BASIC METABOLIC PANEL
BUN/Creatinine Ratio: 12 (ref 12–28)
BUN: 12 mg/dL (ref 8–27)
CO2: 23 mmol/L (ref 20–29)
Calcium: 9.2 mg/dL (ref 8.7–10.3)
Chloride: 102 mmol/L (ref 96–106)
Creatinine, Ser: 1.03 mg/dL — ABNORMAL HIGH (ref 0.57–1.00)
GFR calc Af Amer: 62 mL/min/{1.73_m2} (ref >59)
GFR calc non Af Amer: 54 mL/min/{1.73_m2} — ABNORMAL LOW (ref >59)
Glucose: 90 mg/dL (ref 65–99)
Potassium: 4.2 mmol/L (ref 3.5–5.2)
Sodium: 137 mmol/L (ref 134–144)

## 2019-11-17 ENCOUNTER — Telehealth: Payer: Self-pay | Admitting: Cardiovascular Disease

## 2019-11-17 NOTE — Telephone Encounter (Signed)
-----   Message from Loa Socks, LPN sent at 04/27/7508 12:19 PM EDT -----  ----- Message ----- From: Vesta Mixer, MD Sent: 11/17/2019  12:18 PM EDT To: [facility] Triage  Labs are stable

## 2019-11-17 NOTE — Telephone Encounter (Signed)
Patient returned call for lab results.  

## 2019-11-17 NOTE — Telephone Encounter (Signed)
Returned call to pt and she has been made aware of her lab results with verbal understanding.

## 2019-11-24 DIAGNOSIS — M5432 Sciatica, left side: Secondary | ICD-10-CM | POA: Diagnosis not present

## 2019-11-24 DIAGNOSIS — M62830 Muscle spasm of back: Secondary | ICD-10-CM | POA: Diagnosis not present

## 2019-11-24 DIAGNOSIS — M5136 Other intervertebral disc degeneration, lumbar region: Secondary | ICD-10-CM | POA: Diagnosis not present

## 2019-11-24 DIAGNOSIS — M9903 Segmental and somatic dysfunction of lumbar region: Secondary | ICD-10-CM | POA: Diagnosis not present

## 2019-11-27 DIAGNOSIS — E785 Hyperlipidemia, unspecified: Secondary | ICD-10-CM

## 2019-11-27 NOTE — Telephone Encounter (Signed)
The patient agrees to Lipid Clinic appointment. Scheduled her with PharmD 8/30 as she will already be in the office that day for visit with Dr. Elease Hashimoto. She was grateful for assistance.  Med list updated.

## 2019-11-29 DIAGNOSIS — L821 Other seborrheic keratosis: Secondary | ICD-10-CM | POA: Diagnosis not present

## 2019-11-29 DIAGNOSIS — L814 Other melanin hyperpigmentation: Secondary | ICD-10-CM | POA: Diagnosis not present

## 2019-11-29 DIAGNOSIS — Z85828 Personal history of other malignant neoplasm of skin: Secondary | ICD-10-CM | POA: Diagnosis not present

## 2019-12-12 DIAGNOSIS — M5432 Sciatica, left side: Secondary | ICD-10-CM | POA: Diagnosis not present

## 2019-12-12 DIAGNOSIS — M9903 Segmental and somatic dysfunction of lumbar region: Secondary | ICD-10-CM | POA: Diagnosis not present

## 2019-12-12 DIAGNOSIS — M5136 Other intervertebral disc degeneration, lumbar region: Secondary | ICD-10-CM | POA: Diagnosis not present

## 2019-12-12 DIAGNOSIS — M62830 Muscle spasm of back: Secondary | ICD-10-CM | POA: Diagnosis not present

## 2019-12-24 NOTE — Progress Notes (Signed)
Patient ID: Carla Huber                 DOB: 1946/01/10                    MRN: [id number]     HPI: Carla Huber is a 74 y.o. female patient referred to lipid clinic by Dr. Elease Hashimoto. PMH is significant for CAD with a coronary calcium score of 379 (85th percentile).  She presented to lipid clinic on 10/02/19 after experiencing severe leg cramps in calves with rosuvastatin 5 mg. 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 She was started on atorvastatin 20 mg daily, on which she also developed severe leg cramps. She was then referred to lipid clinic again for initiation of PCSK9 inhibitor.  Patient arrives today in good spirits. She states that she had severe leg cramps in thighs/buttocks that started a few weeks after starting atorvastatin. The cramps stopped once she discontinued the medication. She states that she has made a lot of changes in her diet and exercise. She cut out butter completely and has significantly reduced her consumption of cheese, bacon, pork, and beef. She has also increased her exercise and walks 3 miles daily. She expresses interest in learning about newer cholesterol-lowering medications such as PCSK9 injections.  Current Medications: none Intolerances: rosuvastatin 5 mg daily, atorvastatin 20 mg daily - severe leg cramps with both Risk Factors: CAD, family history, diet LDL goal: < 70 mg/dL  Diet: Cut out butter completely and only uses olive oil. Reduced consumption of cheese, bacon, pork products, and beef. Eats beans and chicken for protein.  Exercise: Increased by walking 3 miles daily targeting a HR of 120-125 bpm; works on her farm (moving hay, tending to animals, walking)  Family History: MI in father, maternal and paternal grandparents; CHF in mother  Social History: never smoker, consumes alcohol (~2 G&Ts or wine most evenings)  Labs: 08/07/19: TC 219, TG  61, HDL 84, LDL 123 (no lipid-lowering medications)  Past Medical History:  Diagnosis Date  . Coronary artery calcification 07/26/2015  . Cough 02/21/2015  . Dyspnea 02/21/2015  . Exposure to respiratory irritant 02/21/2015  . Insomnia   . Palpitations 05/09/2014    Current Outpatient Medications on File Prior to Visit  Medication Sig Dispense Refill  . aspirin EC 81 MG tablet Take 1 tablet (81 mg total) by mouth daily.    Marland Kitchen b complex vitamins tablet Take 1 tablet by mouth daily.    . Fish Oil-Cholecalciferol (FISH OIL + D3) 1000-1000 MG-UNIT CAPS Take 1,000 mg by mouth daily.    . magnesium oxide (MAG-OX) 400 MG tablet Take 400 mg by mouth daily.    . metoprolol tartrate (LOPRESSOR) 50 MG tablet At 2 hours before test, check your pulse. Take 1 tablet (50 mg) if pulse is less than 60 beats/minute. Take 2 tabs (100 mg) if pulse is greater than 65 beats per minute) 2 tablet 0  . nitroGLYCERIN (NITROSTAT) 0.4 MG SL tablet Place 1 tablet (0.4 mg total) under the tongue every 5 (five) minutes as needed for chest pain. 25 tablet 6  . rosuvastatin (CRESTOR) 5 MG tablet Take 1 tablet by mouth daily.    Marland Kitchen telmisartan (MICARDIS) 80 MG tablet Take 1 tablet (80 mg total) by mouth daily. 90 tablet 1  . traZODone (DESYREL) 100 MG tablet Take 1  tablet by mouth daily.    . vitamin C (ASCORBIC ACID) 250 MG tablet Take 250 mg by mouth daily.    Marland Kitchen VITAMIN D PO Take 1,000 mg by mouth daily.     No current facility-administered medications on file prior to visit.    Allergies  Allergen Reactions  . Hydrochlorothiazide Photosensitivity    Pt reports photosensitivity, blurry vision, dizziness while taking this med    Assessment/Plan:  1. Hyperlipidemia - LDL is above goal of < 70 mg/dL. Discussed possible medication options such as Repatha, ezetimibe, and Nexletol/Nexlizet, and patient understands that Repatha is the only medication that will lower LDL to goal of <70 mg/dL. Patient would like to discuss  these options with her husband before making a decision, but she is leaning towards Repatha at this time. Patient is aware of $40 copay with Repatha. Encouraged her to continue maintaining a heart-healthy diet and to continue her daily exercise regimen. Will plan to call patient in 1-2 weeks to discuss which medication she would like to start and schedule follow-up labs at that time.  Tama Headings, PharmD PGY2 Cardiology Pharmacy Resident

## 2019-12-25 ENCOUNTER — Encounter: Payer: Self-pay | Admitting: Cardiovascular Disease

## 2019-12-25 ENCOUNTER — Ambulatory Visit (INDEPENDENT_AMBULATORY_CARE_PROVIDER_SITE_OTHER): Payer: Medicare PPO | Admitting: Pharmacist

## 2019-12-25 ENCOUNTER — Other Ambulatory Visit: Payer: Self-pay

## 2019-12-25 ENCOUNTER — Ambulatory Visit: Payer: Medicare PPO

## 2019-12-25 ENCOUNTER — Ambulatory Visit: Payer: Medicare PPO | Admitting: Cardiovascular Disease

## 2019-12-25 VITALS — BP 134/78 | HR 60 | Ht 65.0 in | Wt 147.2 lb

## 2019-12-25 DIAGNOSIS — I10 Essential (primary) hypertension: Secondary | ICD-10-CM | POA: Diagnosis not present

## 2019-12-25 DIAGNOSIS — I2510 Atherosclerotic heart disease of native coronary artery without angina pectoris: Secondary | ICD-10-CM | POA: Diagnosis not present

## 2019-12-25 DIAGNOSIS — E782 Mixed hyperlipidemia: Secondary | ICD-10-CM | POA: Diagnosis not present

## 2019-12-25 DIAGNOSIS — I2584 Coronary atherosclerosis due to calcified coronary lesion: Secondary | ICD-10-CM

## 2019-12-25 NOTE — Progress Notes (Signed)
Cardiology Office Note   Date:  12/25/2019   ID:  Yecenia, Dalgleish 04-Jan-1946, MRN [id number]  PCP:  Rodrigo Ran, MD  Cardiologist:   Kristeen Miss, MD   Chief Complaint  Patient presents with  . Coronary Artery Disease  . Hypertension   Problem List 1. CAD - coronary calcifications noted by CT scan 2. Palpitations 3.    History of Present Illness: 07/26/2015  CHANETTA MOOSMAN is a 74 y.o. female who presents for further evaluation of some coronary calcifications noted on CT scan . Has had lots of asthma like issues / dyspnea with exertion. CT of the lungs was done for further eval.   Was noted to have coronary calcification in the LAD and RCA region.  Still very active.  Rare episode of CP  She fractured her sternum several years ago and she is not sure if that is what is causing her CP.  Both parents had heart issues. - father had a MI , mother had CHF  Sep 19, 2019:  Delara is seen after a 4 year absence  She has a hx of coronary calcifications  She has been having some dyspnea  And upper left chest tightness.  Can occur spontanusly . 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  She notes that all of the symptoms have improved slightly since she had her blood pressure medications increased.  Her blood pressure is still a little bit elevated. Still eats some salty foods.   No syncope or presyncope   Has a history of hyperlipidemia.  She was previously on Crestor 5 mg a day but started having leg cramps.  She stopped her Crestor at this time.  December 25, 2019: Elizabth is seen today for follow-up of her coronary artery calcifications. Coronary CT angiogram from October 20, 2019 reveals a large RCA with mild disease. The LAD is a large vessel with has moderate plaque in the 50 to 69% range.  The circumflex artery is a small nondominant vessel and is normal. FFR evaluation revealed no significant  flow-limiting lesions.  She is exercising regularly.  Walks 3 miles a day  No cp  Gave her a exercise target HR of 125 - 130  We discussed repatha, / pralulent  She has tried crestor and lipitor - developed leg pain .   Past Medical History:  Diagnosis Date  . Coronary artery calcification 07/26/2015  . Cough 02/21/2015  . Dyspnea 02/21/2015  . Exposure to respiratory irritant 02/21/2015  . Insomnia   . Palpitations 05/09/2014    Past Surgical History:  Procedure Laterality Date  . APPENDECTOMY  1970     Current Outpatient Medications  Medication Sig Dispense Refill  . aspirin EC 81 MG tablet Take 1 tablet (81 mg total) by mouth daily.    Marland Kitchen b complex vitamins tablet Take 1 tablet by mouth daily.    . Fish Oil-Cholecalciferol (FISH OIL + D3) 1000-1000 MG-UNIT CAPS Take 1,000 mg by mouth daily.    . magnesium oxide (MAG-OX) 400 MG tablet Take 400 mg by mouth daily.    . nitroGLYCERIN (NITROSTAT) 0.4 MG SL tablet Place 1 tablet (0.4 mg total) under the tongue every 5 (five) minutes as needed for chest pain. 25 tablet 6  . telmisartan (MICARDIS) 80 MG tablet Take 1 tablet (80 mg total) by mouth daily. 90 tablet 1  . traZODone (  DESYREL) 100 MG tablet Take 1 tablet by mouth daily.    . vitamin C (ASCORBIC ACID) 250 MG tablet Take 250 mg by mouth daily.    Marland Kitchen VITAMIN D PO Take 1,000 mg by mouth daily.     No current facility-administered medications for this visit.    Allergies:   Hydrochlorothiazide    Social History:  The patient  reports that she has never smoked. She has never used smokeless tobacco. She reports current alcohol use. She reports that she does not use drugs.   Family History:  The patient's family history includes Breast cancer in her paternal aunt; Heart attack in her father, maternal grandfather, maternal grandmother, paternal grandfather, and paternal grandmother; Heart failure in her mother; Lymphoma in her sister; Uterine cancer in her mother.    ROS:   Please see the history of present illness.     All other systems are reviewed and negative.    Physical Exam: Blood pressure 134/78, pulse 60, height 5\' 5"  (1.651 m), weight 147 lb 3.2 oz (66.8 kg), SpO2 97 %.  GEN:  Well nourished, well developed in no acute distress HEENT: Normal NECK: No JVD; No carotid bruits LYMPHATICS: No lymphadenopathy CARDIAC: RRR , no murmurs, rubs, gallops RESPIRATORY:  Clear to auscultation without rales, wheezing or rhonchi  ABDOMEN: Soft, non-tender, non-distended MUSCULOSKELETAL:  No edema; No deformity  SKIN: Warm and dry NEUROLOGIC:  Alert and oriented x 3   EKG:     Recent Labs: 11/16/2019: BUN 12; Creatinine, Ser 1.03; Potassium 4.2; Sodium 137    Lipid Panel No results found for: CHOL, TRIG, HDL, CHOLHDL, VLDL, LDLCALC, LDLDIRECT    Wt Readings from Last 3 Encounters:  12/25/19 147 lb 3.2 oz (66.8 kg)  09/19/19 153 lb 6.4 oz (69.6 kg)  03/03/18 150 lb (68 kg)      Other studies Reviewed: Additional studies/ records that were reviewed today include: . Review of the above records demonstrates:    ASSESSMENT AND PLAN:  1.  Coronary artery calcification She is generally intolerant to statins.  We tried Crestor and Lipitor.  She has an appointment to see the lipid clinic right after this visit.  I have strongly encouraged her to work with him to achieve a LDL of less than 70.  .    2. Essential HTN:     Blood pressures well controlled.  3.  Hyperlipidemia:     I will see her in 1 year. Current medicines are reviewed at length with the patient today.  The patient does not have concerns regarding medicines.  The following changes have been made:  no change  Labs/ tests ordered today include:  No orders of the defined types were placed in this encounter.    Disposition:      13/07/19, MD  12/25/2019 5:24 PM    [facility] [address] [place], [place], [place]  [place] Phone: [phone]; Fax:  [phone]

## 2019-12-25 NOTE — Patient Instructions (Signed)

## 2019-12-25 NOTE — Patient Instructions (Signed)
It was nice to meet you today   Your LDL is 123 and your goal is < 70   I will submit information to your insurance to see if they will cover Repatha injections. This medication is stored in the fridge, is given every 2 weeks into the fatty tissue of your stomach, and lowers your LDL cholesterol by 60%  Ezetimibe is an oral medication that is taken once daily that will lower your LDL cholesterol by 20%.  Nexletol is another oral medication that is taken once daily that will lower your LDL cholesterol by 20%.   Nexlizet is a combination oral medication that is taken once daily that will lower your LDL cholesterol by 40%.   I will give you a call after Labor Day to discuss which medication you would like to start.  Please call us at [phone] if you have any questions.

## 2020-01-03 DIAGNOSIS — M62830 Muscle spasm of back: Secondary | ICD-10-CM | POA: Diagnosis not present

## 2020-01-03 DIAGNOSIS — M9903 Segmental and somatic dysfunction of lumbar region: Secondary | ICD-10-CM | POA: Diagnosis not present

## 2020-01-03 DIAGNOSIS — M5136 Other intervertebral disc degeneration, lumbar region: Secondary | ICD-10-CM | POA: Diagnosis not present

## 2020-01-03 DIAGNOSIS — M5432 Sciatica, left side: Secondary | ICD-10-CM | POA: Diagnosis not present

## 2020-01-09 ENCOUNTER — Telehealth: Payer: Self-pay | Admitting: Pharmacist

## 2020-01-09 DIAGNOSIS — I2510 Atherosclerotic heart disease of native coronary artery without angina pectoris: Secondary | ICD-10-CM

## 2020-01-09 DIAGNOSIS — I2584 Coronary atherosclerosis due to calcified coronary lesion: Secondary | ICD-10-CM

## 2020-01-09 NOTE — Telephone Encounter (Signed)
Left voicemail for patient to call back. Called to follow-up from lipid clinic visit on 12/25/19 to see if she made a decision on whether she would like to start Repatha injections. PA for Repatha has been approved through 06/22/2020 and copay should be $40.

## 2020-01-26 DIAGNOSIS — M5432 Sciatica, left side: Secondary | ICD-10-CM | POA: Diagnosis not present

## 2020-01-26 DIAGNOSIS — M62830 Muscle spasm of back: Secondary | ICD-10-CM | POA: Diagnosis not present

## 2020-01-26 DIAGNOSIS — M5136 Other intervertebral disc degeneration, lumbar region: Secondary | ICD-10-CM | POA: Diagnosis not present

## 2020-01-26 DIAGNOSIS — M9903 Segmental and somatic dysfunction of lumbar region: Secondary | ICD-10-CM | POA: Diagnosis not present

## 2020-01-30 ENCOUNTER — Other Ambulatory Visit: Payer: Self-pay | Admitting: Unknown Physician Specialty

## 2020-01-30 ENCOUNTER — Telehealth: Payer: Self-pay | Admitting: Unknown Physician Specialty

## 2020-01-30 DIAGNOSIS — I2510 Atherosclerotic heart disease of native coronary artery without angina pectoris: Secondary | ICD-10-CM | POA: Diagnosis not present

## 2020-01-30 DIAGNOSIS — U071 COVID-19: Secondary | ICD-10-CM | POA: Diagnosis not present

## 2020-01-30 DIAGNOSIS — R059 Cough, unspecified: Secondary | ICD-10-CM | POA: Diagnosis not present

## 2020-01-30 DIAGNOSIS — I10 Essential (primary) hypertension: Secondary | ICD-10-CM

## 2020-01-30 DIAGNOSIS — E785 Hyperlipidemia, unspecified: Secondary | ICD-10-CM | POA: Diagnosis not present

## 2020-01-30 DIAGNOSIS — Z1152 Encounter for screening for COVID-19: Secondary | ICD-10-CM | POA: Diagnosis not present

## 2020-01-30 NOTE — Telephone Encounter (Signed)
Called to Discuss with patient about Covid symptoms and the use of the monoclonal antibody infusion for those with mild to moderate Covid symptoms and at a high risk of hospitalization.     Pt appears to qualify for this infusion due to co-morbid conditions and/or a member of an at-risk group in accordance with the FDA Emergency Use Authorization.    Unable to reach pt    

## 2020-01-30 NOTE — Telephone Encounter (Signed)
I connected by phone with Carla Huber on 01/30/2020 at 5:05 PM to discuss the potential use of a new treatment for mild to moderate COVID-19 viral infection in non-hospitalized patients.  This patient is a 74 y.o. female that meets the FDA criteria for Emergency Use Authorization of COVID monoclonal antibody casirivimab/imdevimab or bamlanivimab/eteseviamb.  Has a (+) direct SARS-CoV-2 viral test result  Has mild or moderate COVID-19   Is NOT hospitalized due to COVID-19  Is within 10 days of symptom onset  Has at least one of the high risk factor(s) for progression to severe COVID-19 and/or hospitalization as defined in EUA.  Specific high risk criteria : Older age (>/= 73 yo) and Cardiovascular disease or hypertension   I have spoken and communicated the following to the patient or parent/caregiver regarding COVID monoclonal antibody treatment:  1. FDA has authorized the emergency use for the treatment of mild to moderate COVID-19 in adults and pediatric patients with positive results of direct SARS-CoV-2 viral testing who are 97 years of age and older weighing at least 40 kg, and who are at high risk for progressing to severe COVID-19 and/or hospitalization.  2. The significant known and potential risks and benefits of COVID monoclonal antibody, and the extent to which such potential risks and benefits are unknown.  3. Information on available alternative treatments and the risks and benefits of those alternatives, including clinical trials.  4. Patients treated with COVID monoclonal antibody should continue to self-isolate and use infection control measures (e.g., wear mask, isolate, social distance, avoid sharing personal items, clean and disinfect "high touch" surfaces, and frequent handwashing) according to CDC guidelines.   5. The patient or parent/caregiver has the option to accept or refuse COVID monoclonal antibody treatment.  After reviewing this information with the  patient, the patient has agreed to receive one of the available covid 19 monoclonal antibodies and will be provided an appropriate fact sheet prior to infusion. Gabriel Cirri, NP 01/30/2020 5:05 PM  Sx onset 10/2

## 2020-02-01 ENCOUNTER — Ambulatory Visit (HOSPITAL_COMMUNITY)
Admission: RE | Admit: 2020-02-01 | Discharge: 2020-02-01 | Disposition: A | Discharge status: Home / Self Care | Payer: Medicare Other | Source: Ambulatory Visit | Attending: Pulmonary Disease | Admitting: Pulmonary Disease

## 2020-02-01 DIAGNOSIS — I10 Essential (primary) hypertension: Secondary | ICD-10-CM | POA: Diagnosis present

## 2020-02-01 DIAGNOSIS — U071 COVID-19: Secondary | ICD-10-CM | POA: Diagnosis present

## 2020-02-01 DIAGNOSIS — Z23 Encounter for immunization: Secondary | ICD-10-CM | POA: Insufficient documentation

## 2020-02-01 MED ORDER — METHYLPREDNISOLONE SODIUM SUCC 125 MG IJ SOLR: 125.0000 mg | Freq: Once | INTRAMUSCULAR | Status: DC | PRN

## 2020-02-01 MED ORDER — SODIUM CHLORIDE 0.9 % IV SOLN: Freq: Once | INTRAVENOUS | Status: AC

## 2020-02-01 MED ORDER — EPINEPHRINE 0.3 MG/0.3ML IJ SOAJ: 0.3000 mg | Freq: Once | INTRAMUSCULAR | Status: DC | PRN

## 2020-02-01 MED ORDER — FAMOTIDINE IN NACL 20-0.9 MG/50ML-% IV SOLN: 20.0000 mg | Freq: Once | INTRAVENOUS | Status: DC | PRN

## 2020-02-01 MED ORDER — ALBUTEROL SULFATE HFA 108 (90 BASE) MCG/ACT IN AERS: 2.0000 | INHALATION_SPRAY | Freq: Once | RESPIRATORY_TRACT | Status: DC | PRN

## 2020-02-01 MED ORDER — DIPHENHYDRAMINE HCL 50 MG/ML IJ SOLN: 50.0000 mg | Freq: Once | INTRAMUSCULAR | Status: DC | PRN

## 2020-02-01 MED ORDER — SODIUM CHLORIDE 0.9 % IV SOLN: INTRAVENOUS | Status: DC | PRN

## 2020-02-01 NOTE — Discharge Instructions (Signed)

## 2020-02-01 NOTE — Progress Notes (Signed)
  Diagnosis: COVID-19  Physician:Dr. Wright  Procedure: Covid Infusion Clinic Med: casirivimab\imdevimab infusion - Provided patient with casirivimab\imdevimab fact sheet for patients, parents and caregivers prior to infusion.  Complications: No immediate complications noted.  Discharge: Discharged home   Carla Huber 02/01/2020  

## 2020-02-06 MED ORDER — REPATHA SURECLICK 140 MG/ML ~~LOC~~ SOAJ: 140.0000 mg | SUBCUTANEOUS | 11 refills | Status: DC

## 2020-02-06 NOTE — Addendum Note (Signed)
Addended byTama Headings on: 02/06/2020 01:36 PM   Modules accepted: Orders

## 2020-02-06 NOTE — Telephone Encounter (Signed)
Called patient to follow-up on starting Repatha. Discussed the importance of starting lipid-lowering medication given her high coronary calcium score, elevated LDL, and risk factors. Patient would like to start Repatha at this time. Sent in new prescription for Repatha 140 mg injection every 2 weeks to [facility]. PA already approved and patient aware of $40 copay. Scheduled follow-up fasting labs for 03/27/20.

## 2020-02-07 DIAGNOSIS — Z23 Encounter for immunization: Secondary | ICD-10-CM | POA: Diagnosis not present

## 2020-02-07 DIAGNOSIS — M810 Age-related osteoporosis without current pathological fracture: Secondary | ICD-10-CM | POA: Diagnosis not present

## 2020-02-07 DIAGNOSIS — E785 Hyperlipidemia, unspecified: Secondary | ICD-10-CM | POA: Diagnosis not present

## 2020-02-07 DIAGNOSIS — J45909 Unspecified asthma, uncomplicated: Secondary | ICD-10-CM | POA: Diagnosis not present

## 2020-02-07 DIAGNOSIS — F329 Major depressive disorder, single episode, unspecified: Secondary | ICD-10-CM | POA: Diagnosis not present

## 2020-02-07 DIAGNOSIS — I10 Essential (primary) hypertension: Secondary | ICD-10-CM | POA: Diagnosis not present

## 2020-02-07 DIAGNOSIS — I2510 Atherosclerotic heart disease of native coronary artery without angina pectoris: Secondary | ICD-10-CM | POA: Diagnosis not present

## 2020-02-12 DIAGNOSIS — M9903 Segmental and somatic dysfunction of lumbar region: Secondary | ICD-10-CM | POA: Diagnosis not present

## 2020-02-12 DIAGNOSIS — M62830 Muscle spasm of back: Secondary | ICD-10-CM | POA: Diagnosis not present

## 2020-02-12 DIAGNOSIS — M5432 Sciatica, left side: Secondary | ICD-10-CM | POA: Diagnosis not present

## 2020-02-12 DIAGNOSIS — M5136 Other intervertebral disc degeneration, lumbar region: Secondary | ICD-10-CM | POA: Diagnosis not present

## 2020-02-19 ENCOUNTER — Encounter: Payer: Self-pay | Admitting: Family Medicine

## 2020-02-19 ENCOUNTER — Other Ambulatory Visit: Payer: Self-pay

## 2020-02-19 ENCOUNTER — Ambulatory Visit: Payer: Medicare PPO | Admitting: Family Medicine

## 2020-02-19 ENCOUNTER — Ambulatory Visit
Admission: RE | Admit: 2020-02-19 | Discharge: 2020-02-19 | Disposition: A | Discharge status: Home / Self Care | Payer: Medicare PPO | Source: Ambulatory Visit | Attending: Family Medicine | Admitting: Family Medicine

## 2020-02-19 VITALS — BP 166/90 | Ht 65.0 in | Wt 140.0 lb

## 2020-02-19 DIAGNOSIS — M62830 Muscle spasm of back: Secondary | ICD-10-CM | POA: Diagnosis not present

## 2020-02-19 DIAGNOSIS — M25551 Pain in right hip: Secondary | ICD-10-CM

## 2020-02-19 DIAGNOSIS — M5136 Other intervertebral disc degeneration, lumbar region: Secondary | ICD-10-CM | POA: Diagnosis not present

## 2020-02-19 DIAGNOSIS — R296 Repeated falls: Secondary | ICD-10-CM | POA: Diagnosis not present

## 2020-02-19 DIAGNOSIS — M533 Sacrococcygeal disorders, not elsewhere classified: Secondary | ICD-10-CM | POA: Diagnosis not present

## 2020-02-19 DIAGNOSIS — M9903 Segmental and somatic dysfunction of lumbar region: Secondary | ICD-10-CM | POA: Diagnosis not present

## 2020-02-19 DIAGNOSIS — M1611 Unilateral primary osteoarthritis, right hip: Secondary | ICD-10-CM | POA: Diagnosis not present

## 2020-02-19 DIAGNOSIS — M5432 Sciatica, left side: Secondary | ICD-10-CM | POA: Diagnosis not present

## 2020-02-19 NOTE — Progress Notes (Signed)
PCP: Rodrigo Ran, MD  Subjective:   HPI: Patient is a 74 y.o. female here for right hip pain.  Patient reports last Saturday 9 days ago she was [place] when she stepped on a piece of barbed wire and fell forward onto the ground. Since that time has had anterior right hip/groin pain. Some swelling but no bruising. Pain with walking - using cane for support. Taking ibuprofen, muscle relaxant and icing. Pain feels deep in right groin without radiation. No numbness. No prior injuries.  Past Medical History:  Diagnosis Date  . Coronary artery calcification 07/26/2015  . Cough 02/21/2015  . Dyspnea 02/21/2015  . Exposure to respiratory irritant 02/21/2015  . Insomnia   . Palpitations 05/09/2014    Current Outpatient Medications on File Prior to Visit  Medication Sig Dispense Refill  . amLODipine (NORVASC) 2.5 MG tablet     . aspirin EC 81 MG tablet Take 1 tablet (81 mg total) by mouth daily.    Marland Kitchen b complex vitamins tablet Take 1 tablet by mouth daily.    . cyclobenzaprine (FLEXERIL) 5 MG tablet     . Evolocumab (REPATHA SURECLICK) 140 MG/ML SOAJ Inject 140 mg into the skin every 14 (fourteen) days. 2 mL 11  . Fish Oil-Cholecalciferol (FISH OIL + D3) 1000-1000 MG-UNIT CAPS Take 1,000 mg by mouth daily.    . magnesium oxide (MAG-OX) 400 MG tablet Take 400 mg by mouth daily.    . nitroGLYCERIN (NITROSTAT) 0.4 MG SL tablet Place 1 tablet (0.4 mg total) under the tongue every 5 (five) minutes as needed for chest pain. 25 tablet 6  . telmisartan (MICARDIS) 80 MG tablet Take 1 tablet (80 mg total) by mouth daily. 90 tablet 1  . traZODone (DESYREL) 100 MG tablet Take 1 tablet by mouth daily.    . vitamin C (ASCORBIC ACID) 250 MG tablet Take 250 mg by mouth daily.    Marland Kitchen VITAMIN D PO Take 1,000 mg by mouth daily.     No current facility-administered medications on file prior to visit.    Past Surgical History:  Procedure Laterality Date  . APPENDECTOMY  1970     Allergies  Allergen Reactions  . Hydrochlorothiazide Photosensitivity    Pt reports photosensitivity, blurry vision, dizziness while taking this med    Social History   Socioeconomic History  . Marital status: Married    Spouse name: Not on file  . Number of children: Not on file  . Years of education: Not on file  . Highest education level: Not on file  Occupational History  . Not on file  Tobacco Use  . Smoking status: Never Smoker  . Smokeless tobacco: Never Used  Substance and Sexual Activity  . Alcohol use: Yes    Alcohol/week: 0.0 standard drinks    Comment: DAILY GLASS OF WINE  . Drug use: No  . Sexual activity: Not on file  Other Topics Concern  . Not on file  Social History Narrative  . Not on file   Social Determinants of Health   Financial Resource Strain:   . Difficulty of Paying Living Expenses: Not on file  Food Insecurity:   . Worried About Programme researcher, broadcasting/film/video in the Last Year: Not on file  . Ran Out of Food in the Last Year: Not on file  Transportation Needs:   . Lack of Transportation (Medical): Not on file  . Lack of Transportation (Non-Medical): Not on file  Physical Activity:   .  Days of Exercise per Week: Not on file  . Minutes of Exercise per Session: Not on file  Stress:   . Feeling of Stress : Not on file  Social Connections:   . Frequency of Communication with Friends and Family: Not on file  . Frequency of Social Gatherings with Friends and Family: Not on file  . Attends Religious Services: Not on file  . Active Member of Clubs or Organizations: Not on file  . Attends Banker Meetings: Not on file  . Marital Status: Not on file  Intimate Partner Violence:   . Fear of Current or Ex-Partner: Not on file  . Emotionally Abused: Not on file  . Physically Abused: Not on file  . Sexually Abused: Not on file    Family History  Problem Relation Age of Onset  . Uterine cancer Mother   . Heart failure Mother   . Heart  attack Father   . Lymphoma Sister   . Heart attack Maternal Grandmother   . Heart attack Maternal Grandfather   . Heart attack Paternal Grandmother   . Heart attack Paternal Grandfather   . Breast cancer Paternal Aunt     BP (!) 166/90   Ht 5\' 5"  (1.651 m)   Wt 140 lb (63.5 kg)   BMI 23.30 kg/m   [facility] 02/19/2020  Frequency of aerobic exercise (# of days/week) 7  Average time in minutes 45  Frequency of strengthening activities (# of days/week) 0    No flowsheet data found.  Review of Systems: See HPI above.     Objective:  Physical Exam:  Gen: NAD, comfortable in exam room  Right hip: No deformity. FROM with 5/5 strength except 4/5 with hip flexion and 5-/5 knee extension both with pain.  Mild pain with resisted hip adduction with 5/5 strength. No tenderness to palpation. NVI distally. Negative logroll, faber, fadir.   Assessment & Plan:  1. Right hip pain - consistent with hip flexor and/or rectus femoris strain.  Will obtain radiographs to ensure she did not sustain an avulsion fracture.  Icing, tylenol or ibuprofen if needed.  Activities and weight bearing as tolerated with cane for support if x-rays are normal with f/u in 2 weeks.

## 2020-02-19 NOTE — Patient Instructions (Signed)
Get x-rays of your hip after you leave today. You have strained your hip flexor - the x-rays will show [place] if you avulsed a piece of the bone with this (hopefully not!). Icing 15 minutes at a time 3-4 times a day. Activities as tolerated as we discussed. Tylenol, ibuprofen as needed. Follow up with me in about 2 weeks if the x-rays look normal - we will plan on starting home exercises at that time after this period of additional relative rest.

## 2020-02-23 DIAGNOSIS — M5136 Other intervertebral disc degeneration, lumbar region: Secondary | ICD-10-CM | POA: Diagnosis not present

## 2020-02-23 DIAGNOSIS — M62830 Muscle spasm of back: Secondary | ICD-10-CM | POA: Diagnosis not present

## 2020-02-23 DIAGNOSIS — M9903 Segmental and somatic dysfunction of lumbar region: Secondary | ICD-10-CM | POA: Diagnosis not present

## 2020-02-23 DIAGNOSIS — M5432 Sciatica, left side: Secondary | ICD-10-CM | POA: Diagnosis not present

## 2020-03-04 ENCOUNTER — Encounter: Payer: Self-pay | Admitting: Family Medicine

## 2020-03-04 ENCOUNTER — Ambulatory Visit: Payer: Medicare PPO | Admitting: Family Medicine

## 2020-03-04 ENCOUNTER — Other Ambulatory Visit: Payer: Self-pay

## 2020-03-04 VITALS — BP 122/82 | Ht 65.0 in | Wt 140.0 lb

## 2020-03-04 DIAGNOSIS — M25551 Pain in right hip: Secondary | ICD-10-CM | POA: Diagnosis not present

## 2020-03-04 NOTE — Progress Notes (Signed)
PCP: Rodrigo Ran, MD  Subjective:   HPI: Patient is a 74 y.o. female here for right hip pain.  10/25: Patient reports last Saturday 9 days ago she [place] when she stepped on a piece of barbed wire and fell forward onto the ground. Since that time has had anterior right hip/groin pain. Some swelling but no bruising. Pain with walking - using cane for support. Taking ibuprofen, muscle relaxant and icing. Pain feels deep in right groin without radiation. No numbness. No prior injuries.  11/8: Patient reports she's improving compared to last visit. Walking is ok but getting going from a seated position, moving in bed, and turning causes pain in right groin. Taking muscle relaxant as needed, rare ibuprofen. Using a cane sparingly for support. No new injuries. Feels like a tight band anterior right thigh.  Past Medical History:  Diagnosis Date  . Coronary artery calcification 07/26/2015  . Cough 02/21/2015  . Dyspnea 02/21/2015  . Exposure to respiratory irritant 02/21/2015  . Insomnia   . Palpitations 05/09/2014    Current Outpatient Medications on File Prior to Visit  Medication Sig Dispense Refill  . amLODipine (NORVASC) 2.5 MG tablet     . aspirin EC 81 MG tablet Take 1 tablet (81 mg total) by mouth daily.    Marland Kitchen b complex vitamins tablet Take 1 tablet by mouth daily.    . cyclobenzaprine (FLEXERIL) 5 MG tablet     . Evolocumab (REPATHA SURECLICK) 140 MG/ML SOAJ Inject 140 mg into the skin every 14 (fourteen) days. 2 mL 11  . Fish Oil-Cholecalciferol (FISH OIL + D3) 1000-1000 MG-UNIT CAPS Take 1,000 mg by mouth daily.    . magnesium oxide (MAG-OX) 400 MG tablet Take 400 mg by mouth daily.    . nitroGLYCERIN (NITROSTAT) 0.4 MG SL tablet Place 1 tablet (0.4 mg total) under the tongue every 5 (five) minutes as needed for chest pain. 25 tablet 6  . telmisartan (MICARDIS) 80 MG tablet Take 1 tablet (80 mg total) by mouth daily. 90 tablet 1  . traZODone  (DESYREL) 100 MG tablet Take 1 tablet by mouth daily.    . vitamin C (ASCORBIC ACID) 250 MG tablet Take 250 mg by mouth daily.    Marland Kitchen VITAMIN D PO Take 1,000 mg by mouth daily.     No current facility-administered medications on file prior to visit.    Past Surgical History:  Procedure Laterality Date  . APPENDECTOMY  1970    Allergies  Allergen Reactions  . Hydrochlorothiazide Photosensitivity    Pt reports photosensitivity, blurry vision, dizziness while taking this med  . Other Other (See Comments)    Not sure    Social History   Socioeconomic History  . Marital status: Married    Spouse name: Not on file  . Number of children: Not on file  . Years of education: Not on file  . Highest education level: Not on file  Occupational History  . Not on file  Tobacco Use  . Smoking status: Never Smoker  . Smokeless tobacco: Never Used  Substance and Sexual Activity  . Alcohol use: Yes    Alcohol/week: 0.0 standard drinks    Comment: DAILY GLASS OF WINE  . Drug use: No  . Sexual activity: Not on file  Other Topics Concern  . Not on file  Social History Narrative  . Not on file   Social Determinants of Health   Financial Resource Strain:   .  Difficulty of Paying Living Expenses: Not on file  Food Insecurity:   . Worried About Programme researcher, broadcasting/film/video in the Last Year: Not on file  . Ran Out of Food in the Last Year: Not on file  Transportation Needs:   . Lack of Transportation (Medical): Not on file  . Lack of Transportation (Non-Medical): Not on file  Physical Activity:   . Days of Exercise per Week: Not on file  . Minutes of Exercise per Session: Not on file  Stress:   . Feeling of Stress : Not on file  Social Connections:   . Frequency of Communication with Friends and Family: Not on file  . Frequency of Social Gatherings with Friends and Family: Not on file  . Attends Religious Services: Not on file  . Active Member of Clubs or Organizations: Not on file  .  Attends Banker Meetings: Not on file  . Marital Status: Not on file  Intimate Partner Violence:   . Fear of Current or Ex-Partner: Not on file  . Emotionally Abused: Not on file  . Physically Abused: Not on file  . Sexually Abused: Not on file    Family History  Problem Relation Age of Onset  . Uterine cancer Mother   . Heart failure Mother   . Heart attack Father   . Lymphoma Sister   . Heart attack Maternal Grandmother   . Heart attack Maternal Grandfather   . Heart attack Paternal Grandmother   . Heart attack Paternal Grandfather   . Breast cancer Paternal Aunt     BP 122/82   Ht 5\' 5"  (1.651 m)   Wt 140 lb (63.5 kg)   BMI 23.30 kg/m   [facility] Adult Exercise 02/19/2020 03/04/2020  Frequency of aerobic exercise (# of days/week) 7 7  Average time in minutes 45 45  Frequency of strengthening activities (# of days/week) 0 0    No flowsheet data found.  Review of Systems: See HPI above.     Objective:  Physical Exam:  Gen: NAD, comfortable in exam room  Right Hip: No deformity. FROM with 5/5 strength but pain on resisted hip flexion. No tenderness to palpation. NVI distally. Negative logroll.   Assessment & Plan:  1. Right hip pain - independently reviewed radiographs and noted possible nondisplaced superior ramus fracture vs nutrient foramen with hip flexor strain.  Treat conservatively as we discussed.  She's improving now 3 weeks out from injury.  Cane for support.  Icing, tylenol or ibuprofen.  F/u in 3 weeks.  Consider repeat imaging if not improving as expected.

## 2020-03-04 NOTE — Patient Instructions (Signed)
You are where I would expect you to be at this point. Use cane for support. Ok to walk, cycle, swim, use elliptical if these do not worsen pain. Icing (or heat at this point) if needed 15 minutes at a time. Tylenol, ibuprofen only if needed. Ok to use the muscle relaxant Dr. Waynard Edwards prescribed as needed also. Follow up with me in 3 weeks. Hopefully at that point you'll feel significantly better besides having stiffness and we can be more aggressive with home exercises.

## 2020-03-25 ENCOUNTER — Other Ambulatory Visit: Payer: Self-pay

## 2020-03-25 ENCOUNTER — Ambulatory Visit: Payer: Medicare PPO | Admitting: Family Medicine

## 2020-03-25 ENCOUNTER — Encounter: Payer: Self-pay | Admitting: Family Medicine

## 2020-03-25 VITALS — BP 138/86 | Ht 65.0 in | Wt 140.0 lb

## 2020-03-25 DIAGNOSIS — M25551 Pain in right hip: Secondary | ICD-10-CM | POA: Diagnosis not present

## 2020-03-25 NOTE — Progress Notes (Signed)
PCP: Rodrigo Ran, MD  Subjective:   HPI: Patient is a 74 y.o. female here for right hip pain.  10/25: Patient reports last Saturday 9 days ago she [place] when she stepped on a piece of barbed wire and fell forward onto the ground. Since that time has had anterior right hip/groin pain. Some swelling but no bruising. Pain with walking - using cane for support. Taking ibuprofen, muscle relaxant and icing. Pain feels deep in right groin without radiation. No numbness. No prior injuries.  11/8: Patient reports she's improving compared to last visit. Walking is ok but getting going from a seated position, moving in bed, and turning causes pain in right groin. Taking muscle relaxant as needed, rare ibuprofen. Using a cane sparingly for support. No new injuries. Feels like a tight band anterior right thigh.  11/29: Patient reports she's at least 70% better at this point. Pain at worst up to 5/10 and more of a discomfort. Able to tend to the farm, has had to climb over fence and done ok. No numbness, other complaints. Interested in physical therapy.  Past Medical History:  Diagnosis Date  . Coronary artery calcification 07/26/2015  . Cough 02/21/2015  . Dyspnea 02/21/2015  . Exposure to respiratory irritant 02/21/2015  . Insomnia   . Palpitations 05/09/2014    Current Outpatient Medications on File Prior to Visit  Medication Sig Dispense Refill  . amLODipine (NORVASC) 2.5 MG tablet     . aspirin EC 81 MG tablet Take 1 tablet (81 mg total) by mouth daily.    Marland Kitchen b complex vitamins tablet Take 1 tablet by mouth daily.    . cyclobenzaprine (FLEXERIL) 5 MG tablet     . Evolocumab (REPATHA SURECLICK) 140 MG/ML SOAJ Inject 140 mg into the skin every 14 (fourteen) days. 2 mL 11  . Fish Oil-Cholecalciferol (FISH OIL + D3) 1000-1000 MG-UNIT CAPS Take 1,000 mg by mouth daily.    . magnesium oxide (MAG-OX) 400 MG tablet Take 400 mg by mouth daily.    .  nitroGLYCERIN (NITROSTAT) 0.4 MG SL tablet Place 1 tablet (0.4 mg total) under the tongue every 5 (five) minutes as needed for chest pain. 25 tablet 6  . telmisartan (MICARDIS) 80 MG tablet Take 1 tablet (80 mg total) by mouth daily. 90 tablet 1  . traZODone (DESYREL) 100 MG tablet Take 1 tablet by mouth daily.    . vitamin C (ASCORBIC ACID) 250 MG tablet Take 250 mg by mouth daily.    Marland Kitchen VITAMIN D PO Take 1,000 mg by mouth daily.     No current facility-administered medications on file prior to visit.    Past Surgical History:  Procedure Laterality Date  . APPENDECTOMY  1970    Allergies  Allergen Reactions  . Hydrochlorothiazide Photosensitivity    Pt reports photosensitivity, blurry vision, dizziness while taking this med  . Other Other (See Comments)    Not sure    Social History   Socioeconomic History  . Marital status: Married    Spouse name: Not on file  . Number of children: Not on file  . Years of education: Not on file  . Highest education level: Not on file  Occupational History  . Not on file  Tobacco Use  . Smoking status: Never Smoker  . Smokeless tobacco: Never Used  Substance and Sexual Activity  . Alcohol use: Yes    Alcohol/week: 0.0 standard drinks    Comment: DAILY GLASS  OF WINE  . Drug use: No  . Sexual activity: Not on file  Other Topics Concern  . Not on file  Social History Narrative  . Not on file   Social Determinants of Health   Financial Resource Strain:   . Difficulty of Paying Living Expenses: Not on file  Food Insecurity:   . Worried About Programme researcher, broadcasting/film/video in the Last Year: Not on file  . Ran Out of Food in the Last Year: Not on file  Transportation Needs:   . Lack of Transportation (Medical): Not on file  . Lack of Transportation (Non-Medical): Not on file  Physical Activity:   . Days of Exercise per Week: Not on file  . Minutes of Exercise per Session: Not on file  Stress:   . Feeling of Stress : Not on file  Social  Connections:   . Frequency of Communication with Friends and Family: Not on file  . Frequency of Social Gatherings with Friends and Family: Not on file  . Attends Religious Services: Not on file  . Active Member of Clubs or Organizations: Not on file  . Attends Banker Meetings: Not on file  . Marital Status: Not on file  Intimate Partner Violence:   . Fear of Current or Ex-Partner: Not on file  . Emotionally Abused: Not on file  . Physically Abused: Not on file  . Sexually Abused: Not on file    Family History  Problem Relation Age of Onset  . Uterine cancer Mother   . Heart failure Mother   . Heart attack Father   . Lymphoma Sister   . Heart attack Maternal Grandmother   . Heart attack Maternal Grandfather   . Heart attack Paternal Grandmother   . Heart attack Paternal Grandfather   . Breast cancer Paternal Aunt     BP 138/86   Ht 5\' 5"  (1.651 m)   Wt 140 lb (63.5 kg)   BMI 23.30 kg/m   [facility] Adult Exercise 02/19/2020 03/04/2020 03/25/2020  Frequency of aerobic exercise (# of days/week) 7 7 0  Average time in minutes 45 45 0  Frequency of strengthening activities (# of days/week) 0 0 0    No flowsheet data found.  Review of Systems: See HPI above.     Objective:  Physical Exam:  Gen: NAD, comfortable in exam room  Right hip: No deformity. FROM with 5/5 strength with pain on resisted ER and IR mildly.  No other pain. No tenderness to palpation. NVI distally. Negative logroll, straight leg raise.   Assessment & Plan:  1. Right hip pain - 2/2 healing nondisplaced superior ramus fracture.  Continues to clinically improve.  Start physical therapy now that she's 6 weeks out from injury.  Icing, tylenol, ibuprofen only if needed.  F/u in 6 weeks.

## 2020-03-25 NOTE — Patient Instructions (Signed)
You're doing great! Start physical therapy with Carla Huber to regain strength following your healing pubic ramus fracture. Do home exercises on days you don't go to therapy. Activities as tolerated otherwise. Follow up in 6 weeks.

## 2020-03-26 DIAGNOSIS — M25551 Pain in right hip: Secondary | ICD-10-CM | POA: Diagnosis not present

## 2020-03-26 DIAGNOSIS — R269 Unspecified abnormalities of gait and mobility: Secondary | ICD-10-CM | POA: Diagnosis not present

## 2020-03-26 DIAGNOSIS — M79604 Pain in right leg: Secondary | ICD-10-CM | POA: Diagnosis not present

## 2020-03-27 ENCOUNTER — Other Ambulatory Visit: Payer: Medicare PPO

## 2020-03-27 ENCOUNTER — Other Ambulatory Visit: Payer: Self-pay

## 2020-03-27 DIAGNOSIS — E785 Hyperlipidemia, unspecified: Secondary | ICD-10-CM

## 2020-03-27 LAB — LIPID PANEL
Chol/HDL Ratio: 1.8 ratio (ref 0.0–4.4)
Cholesterol, Total: 154 mg/dL (ref 100–199)
HDL: 88 mg/dL (ref >39)
LDL Chol Calc (NIH): 58 mg/dL (ref 0–99)
Triglycerides: 28 mg/dL (ref 0–149)
VLDL Cholesterol Cal: 8 mg/dL (ref 5–40)

## 2020-04-03 DIAGNOSIS — M25551 Pain in right hip: Secondary | ICD-10-CM | POA: Diagnosis not present

## 2020-04-03 DIAGNOSIS — M79604 Pain in right leg: Secondary | ICD-10-CM | POA: Diagnosis not present

## 2020-04-03 DIAGNOSIS — R269 Unspecified abnormalities of gait and mobility: Secondary | ICD-10-CM | POA: Diagnosis not present

## 2020-04-15 DIAGNOSIS — M25551 Pain in right hip: Secondary | ICD-10-CM | POA: Diagnosis not present

## 2020-04-15 DIAGNOSIS — M79604 Pain in right leg: Secondary | ICD-10-CM | POA: Diagnosis not present

## 2020-04-15 DIAGNOSIS — R269 Unspecified abnormalities of gait and mobility: Secondary | ICD-10-CM | POA: Diagnosis not present

## 2020-04-25 DIAGNOSIS — H1031 Unspecified acute conjunctivitis, right eye: Secondary | ICD-10-CM | POA: Diagnosis not present

## 2020-05-01 ENCOUNTER — Other Ambulatory Visit: Payer: Self-pay

## 2020-05-01 ENCOUNTER — Encounter: Payer: Self-pay | Admitting: Family Medicine

## 2020-05-01 ENCOUNTER — Ambulatory Visit: Payer: Medicare PPO | Admitting: Family Medicine

## 2020-05-01 VITALS — BP 118/76 | Ht 65.0 in

## 2020-05-01 DIAGNOSIS — R269 Unspecified abnormalities of gait and mobility: Secondary | ICD-10-CM | POA: Diagnosis not present

## 2020-05-01 DIAGNOSIS — M25551 Pain in right hip: Secondary | ICD-10-CM

## 2020-05-01 DIAGNOSIS — M79604 Pain in right leg: Secondary | ICD-10-CM | POA: Diagnosis not present

## 2020-05-01 NOTE — Progress Notes (Signed)
PCP: Rodrigo Ran, MD  Subjective:   HPI: Patient is a 75 y.o. female here for right hip pain.  10/25: Patient reports last Saturday 9 days ago she was [place] when she stepped on a piece of barbed wire and fell forward onto the ground. Since that time has had anterior right hip/groin pain. Some swelling but no bruising. Pain with walking - using cane for support. Taking ibuprofen, muscle relaxant and icing. Pain feels deep in right groin without radiation. No numbness. No prior injuries.  11/8: Patient reports she's improving compared to last visit. Walking is ok but getting going from a seated position, moving in bed, and turning causes pain in right groin. Taking muscle relaxant as needed, rare ibuprofen. Using a cane sparingly for support. No new injuries. Feels like a tight band anterior right thigh.  11/29: Patient reports she's at least 70% better at this point. Pain at worst up to 5/10 and more of a discomfort. Able to tend to the farm, has had to climb over fence and done ok. No numbness, other complaints. Interested in physical therapy.  05/01/20: Patient reports she's doing great. Still in physical therapy and doing home exercises. Some minimal discomfort anteriorly right groin at times but none currently.  Past Medical History:  Diagnosis Date  . Coronary artery calcification 07/26/2015  . Cough 02/21/2015  . Dyspnea 02/21/2015  . Exposure to respiratory irritant 02/21/2015  . Insomnia   . Palpitations 05/09/2014    Current Outpatient Medications on File Prior to Visit  Medication Sig Dispense Refill  . amLODipine (NORVASC) 2.5 MG tablet     . aspirin EC 81 MG tablet Take 1 tablet (81 mg total) by mouth daily.    Marland Kitchen b complex vitamins tablet Take 1 tablet by mouth daily.    . cyclobenzaprine (FLEXERIL) 5 MG tablet     . Evolocumab (REPATHA SURECLICK) 140 MG/ML SOAJ Inject 140 mg into the skin every 14 (fourteen) days. 2 mL 11  . Fish  Oil-Cholecalciferol (FISH OIL + D3) 1000-1000 MG-UNIT CAPS Take 1,000 mg by mouth daily.    . magnesium oxide (MAG-OX) 400 MG tablet Take 400 mg by mouth daily.    . nitroGLYCERIN (NITROSTAT) 0.4 MG SL tablet Place 1 tablet (0.4 mg total) under the tongue every 5 (five) minutes as needed for chest pain. 25 tablet 6  . telmisartan (MICARDIS) 80 MG tablet Take 1 tablet (80 mg total) by mouth daily. 90 tablet 1  . traZODone (DESYREL) 100 MG tablet Take 1 tablet by mouth daily.    . vitamin C (ASCORBIC ACID) 250 MG tablet Take 250 mg by mouth daily.    Marland Kitchen VITAMIN D PO Take 1,000 mg by mouth daily.     No current facility-administered medications on file prior to visit.    Past Surgical History:  Procedure Laterality Date  . APPENDECTOMY  1970    Allergies  Allergen Reactions  . Hydrochlorothiazide Photosensitivity    Pt reports photosensitivity, blurry vision, dizziness while taking this med  . Other Other (See Comments)    Not sure    Social History   Socioeconomic History  . Marital status: Married    Spouse name: Not on file  . Number of children: Not on file  . Years of education: Not on file  . Highest education level: Not on file  Occupational History  . Not on file  Tobacco Use  . Smoking status: Never Smoker  . Smokeless  tobacco: Never Used  Substance and Sexual Activity  . Alcohol use: Yes    Alcohol/week: 0.0 standard drinks    Comment: DAILY GLASS OF WINE  . Drug use: No  . Sexual activity: Not on file  Other Topics Concern  . Not on file  Social History Narrative  . Not on file   Social Determinants of Health   Financial Resource Strain: Not on file  Food Insecurity: Not on file  Transportation Needs: Not on file  Physical Activity: Not on file  Stress: Not on file  Social Connections: Not on file  Intimate Partner Violence: Not on file    Family History  Problem Relation Age of Onset  . Uterine cancer Mother   . Heart failure Mother   . Heart  attack Father   . Lymphoma Sister   . Heart attack Maternal Grandmother   . Heart attack Maternal Grandfather   . Heart attack Paternal Grandmother   . Heart attack Paternal Grandfather   . Breast cancer Paternal Aunt     BP 118/76   Ht 5\' 5"  (1.651 m)   BMI 23.30 kg/m   [facility] Adult Exercise 02/19/2020 03/04/2020 03/25/2020  Frequency of aerobic exercise (# of days/week) 7 7 0  Average time in minutes 45 45 0  Frequency of strengthening activities (# of days/week) 0 0 0    No flowsheet data found.  Review of Systems: See HPI above.     Objective:  Physical Exam:  Gen: NAD, comfortable in exam room  Right hip: No deformity. FROM with 5/5 strength. No tenderness to palpation. NVI distally. Negative logroll, faber, fadir, SLR.   Assessment & Plan:  1. Right hip pain - 2/2 healed nondisplaced superior ramus fracture.  Doing extremely well with minimal soreness now.  Transition to home exercises when she feels comfortable.  Tylenol, ibuprofen only if needed.  F/u prn.

## 2020-05-02 DIAGNOSIS — H10411 Chronic giant papillary conjunctivitis, right eye: Secondary | ICD-10-CM | POA: Diagnosis not present

## 2020-05-06 DIAGNOSIS — M79604 Pain in right leg: Secondary | ICD-10-CM | POA: Diagnosis not present

## 2020-05-06 DIAGNOSIS — R269 Unspecified abnormalities of gait and mobility: Secondary | ICD-10-CM | POA: Diagnosis not present

## 2020-05-06 DIAGNOSIS — M25551 Pain in right hip: Secondary | ICD-10-CM | POA: Diagnosis not present

## 2020-05-14 DIAGNOSIS — M79604 Pain in right leg: Secondary | ICD-10-CM | POA: Diagnosis not present

## 2020-05-14 DIAGNOSIS — R269 Unspecified abnormalities of gait and mobility: Secondary | ICD-10-CM | POA: Diagnosis not present

## 2020-05-14 DIAGNOSIS — M25551 Pain in right hip: Secondary | ICD-10-CM | POA: Diagnosis not present

## 2020-05-16 ENCOUNTER — Other Ambulatory Visit: Payer: Self-pay | Admitting: Cardiovascular Disease

## 2020-05-16 DIAGNOSIS — Z79899 Other long term (current) drug therapy: Secondary | ICD-10-CM

## 2020-05-16 DIAGNOSIS — I10 Essential (primary) hypertension: Secondary | ICD-10-CM

## 2020-08-08 DIAGNOSIS — H2513 Age-related nuclear cataract, bilateral: Secondary | ICD-10-CM | POA: Diagnosis not present

## 2020-08-08 DIAGNOSIS — H524 Presbyopia: Secondary | ICD-10-CM | POA: Diagnosis not present

## 2020-08-08 DIAGNOSIS — H5203 Hypermetropia, bilateral: Secondary | ICD-10-CM | POA: Diagnosis not present

## 2020-08-12 DIAGNOSIS — M810 Age-related osteoporosis without current pathological fracture: Secondary | ICD-10-CM | POA: Diagnosis not present

## 2020-08-12 DIAGNOSIS — E785 Hyperlipidemia, unspecified: Secondary | ICD-10-CM | POA: Diagnosis not present

## 2020-08-19 DIAGNOSIS — I700 Atherosclerosis of aorta: Secondary | ICD-10-CM | POA: Diagnosis not present

## 2020-08-19 DIAGNOSIS — R030 Elevated blood-pressure reading, without diagnosis of hypertension: Secondary | ICD-10-CM | POA: Diagnosis not present

## 2020-08-19 DIAGNOSIS — E785 Hyperlipidemia, unspecified: Secondary | ICD-10-CM | POA: Diagnosis not present

## 2020-08-19 DIAGNOSIS — I2510 Atherosclerotic heart disease of native coronary artery without angina pectoris: Secondary | ICD-10-CM | POA: Diagnosis not present

## 2020-08-19 DIAGNOSIS — R82998 Other abnormal findings in urine: Secondary | ICD-10-CM | POA: Diagnosis not present

## 2020-08-19 DIAGNOSIS — M810 Age-related osteoporosis without current pathological fracture: Secondary | ICD-10-CM | POA: Diagnosis not present

## 2020-08-19 DIAGNOSIS — Z0000 Encounter for general adult medical examination without abnormal findings: Secondary | ICD-10-CM | POA: Diagnosis not present

## 2020-08-19 DIAGNOSIS — I2729 Other secondary pulmonary hypertension: Secondary | ICD-10-CM | POA: Diagnosis not present

## 2020-08-19 DIAGNOSIS — M6530 Trigger finger, unspecified finger: Secondary | ICD-10-CM | POA: Diagnosis not present

## 2020-08-19 DIAGNOSIS — I10 Essential (primary) hypertension: Secondary | ICD-10-CM | POA: Diagnosis not present

## 2020-09-02 ENCOUNTER — Encounter (HOSPITAL_COMMUNITY): Payer: Medicare PPO

## 2020-10-21 ENCOUNTER — Other Ambulatory Visit: Payer: Self-pay | Admitting: Internal Medicine

## 2020-10-21 ENCOUNTER — Ambulatory Visit
Admission: RE | Admit: 2020-10-21 | Discharge: 2020-10-21 | Disposition: A | Discharge status: Home / Self Care | Payer: Medicare PPO | Source: Ambulatory Visit | Attending: Internal Medicine | Admitting: Internal Medicine

## 2020-10-21 ENCOUNTER — Other Ambulatory Visit: Payer: Self-pay

## 2020-10-21 DIAGNOSIS — Z1231 Encounter for screening mammogram for malignant neoplasm of breast: Secondary | ICD-10-CM

## 2020-10-31 ENCOUNTER — Other Ambulatory Visit: Payer: Self-pay | Admitting: Cardiovascular Disease

## 2020-10-31 DIAGNOSIS — I10 Essential (primary) hypertension: Secondary | ICD-10-CM

## 2020-10-31 DIAGNOSIS — Z79899 Other long term (current) drug therapy: Secondary | ICD-10-CM

## 2020-11-08 DIAGNOSIS — M21612 Bunion of left foot: Secondary | ICD-10-CM | POA: Diagnosis not present

## 2020-11-08 DIAGNOSIS — M7742 Metatarsalgia, left foot: Secondary | ICD-10-CM | POA: Diagnosis not present

## 2020-11-08 DIAGNOSIS — M9272 Juvenile osteochondrosis of metatarsus, left foot: Secondary | ICD-10-CM | POA: Diagnosis not present

## 2020-11-28 DIAGNOSIS — F329 Major depressive disorder, single episode, unspecified: Secondary | ICD-10-CM | POA: Diagnosis not present

## 2020-11-28 DIAGNOSIS — J45909 Unspecified asthma, uncomplicated: Secondary | ICD-10-CM | POA: Diagnosis not present

## 2020-11-28 DIAGNOSIS — D2262 Melanocytic nevi of left upper limb, including shoulder: Secondary | ICD-10-CM | POA: Diagnosis not present

## 2020-11-28 DIAGNOSIS — I2510 Atherosclerotic heart disease of native coronary artery without angina pectoris: Secondary | ICD-10-CM | POA: Diagnosis not present

## 2020-11-28 DIAGNOSIS — E785 Hyperlipidemia, unspecified: Secondary | ICD-10-CM | POA: Diagnosis not present

## 2020-11-28 DIAGNOSIS — L570 Actinic keratosis: Secondary | ICD-10-CM | POA: Diagnosis not present

## 2020-11-28 DIAGNOSIS — D225 Melanocytic nevi of trunk: Secondary | ICD-10-CM | POA: Diagnosis not present

## 2020-11-28 DIAGNOSIS — M542 Cervicalgia: Secondary | ICD-10-CM | POA: Diagnosis not present

## 2020-11-28 DIAGNOSIS — L821 Other seborrheic keratosis: Secondary | ICD-10-CM | POA: Diagnosis not present

## 2020-11-28 DIAGNOSIS — I10 Essential (primary) hypertension: Secondary | ICD-10-CM | POA: Diagnosis not present

## 2020-11-28 DIAGNOSIS — D2261 Melanocytic nevi of right upper limb, including shoulder: Secondary | ICD-10-CM | POA: Diagnosis not present

## 2020-11-28 DIAGNOSIS — Z85828 Personal history of other malignant neoplasm of skin: Secondary | ICD-10-CM | POA: Diagnosis not present

## 2020-11-28 DIAGNOSIS — L814 Other melanin hyperpigmentation: Secondary | ICD-10-CM | POA: Diagnosis not present

## 2020-11-28 DIAGNOSIS — N390 Urinary tract infection, site not specified: Secondary | ICD-10-CM | POA: Diagnosis not present

## 2020-12-18 ENCOUNTER — Encounter: Payer: Self-pay | Admitting: Cardiovascular Disease

## 2020-12-18 NOTE — Progress Notes (Signed)
This encounter was created in error - please disregard.

## 2020-12-19 ENCOUNTER — Encounter: Payer: Medicare PPO | Admitting: Cardiovascular Disease

## 2020-12-19 ENCOUNTER — Other Ambulatory Visit: Payer: Self-pay

## 2020-12-19 ENCOUNTER — Encounter: Payer: Self-pay | Admitting: Cardiovascular Disease

## 2020-12-19 VITALS — BP 138/80 | HR 70 | Ht 65.0 in | Wt 154.6 lb

## 2020-12-19 DIAGNOSIS — I2510 Atherosclerotic heart disease of native coronary artery without angina pectoris: Secondary | ICD-10-CM

## 2020-12-19 DIAGNOSIS — E782 Mixed hyperlipidemia: Secondary | ICD-10-CM

## 2020-12-19 NOTE — Patient Instructions (Signed)
Medication Instructions:  Your physician recommends that you continue on your current medications as directed. Please refer to the Current Medication list given to you today.  *If you need a refill on your cardiac medications before your next appointment, please call your pharmacy*   Lab Work: BMET, NMR profile, ALT, Lipoprotein A, Apolipoprotein B today  If you have labs (blood work) drawn today and your tests are completely normal, you will receive your results only by: MyChart Message (if you have MyChart) OR A paper copy in the mail If you have any lab test that is abnormal or we need to change your treatment, we will call you to review the results.   Testing/Procedures: None   Follow-Up: At [facility], you and your health needs are our priority.  As part of our continuing mission to provide you with exceptional heart care, we have created designated Provider Care Teams.  These Care Teams include your primary Cardiologist (physician) and Advanced Practice Providers (APPs -  Physician Assistants and Nurse Practitioners) who all work together to provide you with the care you need, when you need it.  We recommend signing up for the patient portal called "MyChart".  Sign up information is provided on this After Visit Summary.  MyChart is used to connect with patients for Virtual Visits (Telemedicine).  Patients are able to view lab/test results, encounter notes, upcoming appointments, etc.  Non-urgent messages can be sent to your provider as well.   To learn more about what you can do with MyChart, go to [email or web address].    Your next appointment:   1 year(s)  The format for your next appointment:   In Person  Provider:   You may see Kristeen Miss, MD or one of the following Advanced Practice Providers on your designated Care Team:   Tereso Newcomer, PA-C [place] Aus, [place]   Other Instructions

## 2020-12-20 LAB — BASIC METABOLIC PANEL
BUN/Creatinine Ratio: 13 (ref 12–28)
BUN: 14 mg/dL (ref 8–27)
CO2: 22 mmol/L (ref 20–29)
Calcium: 9.6 mg/dL (ref 8.7–10.3)
Chloride: 102 mmol/L (ref 96–106)
Creatinine, Ser: 1.04 mg/dL — ABNORMAL HIGH (ref 0.57–1.00)
Glucose: 88 mg/dL (ref 65–99)
Potassium: 4.6 mmol/L (ref 3.5–5.2)
Sodium: 137 mmol/L (ref 134–144)
eGFR: 56 mL/min/{1.73_m2} — ABNORMAL LOW (ref >59)

## 2020-12-20 LAB — NMR, LIPOPROFILE
Cholesterol, Total: 147 mg/dL (ref 100–199)
HDL Particle Number: 47 umol/L (ref >=30.5)
HDL-C: 78 mg/dL (ref >39)
LDL Particle Number: 661 nmol/L (ref <1000)
LDL Size: 20.1 nm — ABNORMAL LOW (ref >20.5)
LDL-C (NIH Calc): 59 mg/dL (ref 0–99)
LP-IR Score: <25 (ref <=45)
Small LDL Particle Number: 338 nmol/L (ref <=527)
Triglycerides: 43 mg/dL (ref 0–149)

## 2020-12-20 LAB — LIPOPROTEIN A (LPA): Lipoprotein (a): 116.6 nmol/L — ABNORMAL HIGH (ref <75.0)

## 2020-12-20 LAB — ALT: ALT: 14 IU/L (ref 0–32)

## 2020-12-20 LAB — APOLIPOPROTEIN B: Apolipoprotein B: 52 mg/dL (ref <90)

## 2021-02-15 ENCOUNTER — Other Ambulatory Visit: Payer: Self-pay | Admitting: Cardiovascular Disease

## 2021-02-15 DIAGNOSIS — I10 Essential (primary) hypertension: Secondary | ICD-10-CM

## 2021-02-15 DIAGNOSIS — Z79899 Other long term (current) drug therapy: Secondary | ICD-10-CM

## 2021-02-16 ENCOUNTER — Other Ambulatory Visit: Payer: Self-pay | Admitting: Cardiovascular Disease

## 2021-02-16 DIAGNOSIS — I2584 Coronary atherosclerosis due to calcified coronary lesion: Secondary | ICD-10-CM

## 2021-02-16 DIAGNOSIS — I2510 Atherosclerotic heart disease of native coronary artery without angina pectoris: Secondary | ICD-10-CM

## 2021-02-19 DIAGNOSIS — Z1212 Encounter for screening for malignant neoplasm of rectum: Secondary | ICD-10-CM | POA: Diagnosis not present

## 2021-02-19 DIAGNOSIS — M5441 Lumbago with sciatica, right side: Secondary | ICD-10-CM | POA: Diagnosis not present

## 2021-02-19 DIAGNOSIS — M5442 Lumbago with sciatica, left side: Secondary | ICD-10-CM | POA: Diagnosis not present

## 2021-02-19 DIAGNOSIS — I10 Essential (primary) hypertension: Secondary | ICD-10-CM | POA: Diagnosis not present

## 2021-02-19 DIAGNOSIS — I2510 Atherosclerotic heart disease of native coronary artery without angina pectoris: Secondary | ICD-10-CM | POA: Diagnosis not present

## 2021-02-19 DIAGNOSIS — Z23 Encounter for immunization: Secondary | ICD-10-CM | POA: Diagnosis not present

## 2021-02-19 DIAGNOSIS — E785 Hyperlipidemia, unspecified: Secondary | ICD-10-CM | POA: Diagnosis not present

## 2021-02-19 DIAGNOSIS — F329 Major depressive disorder, single episode, unspecified: Secondary | ICD-10-CM | POA: Diagnosis not present

## 2021-05-23 ENCOUNTER — Other Ambulatory Visit: Payer: Self-pay | Admitting: Cardiovascular Disease

## 2021-05-23 DIAGNOSIS — Z79899 Other long term (current) drug therapy: Secondary | ICD-10-CM

## 2021-05-23 DIAGNOSIS — I10 Essential (primary) hypertension: Secondary | ICD-10-CM

## 2021-08-04 DIAGNOSIS — B354 Tinea corporis: Secondary | ICD-10-CM | POA: Diagnosis not present

## 2021-08-25 ENCOUNTER — Other Ambulatory Visit: Payer: Self-pay | Admitting: Cardiovascular Disease

## 2021-08-25 DIAGNOSIS — Z79899 Other long term (current) drug therapy: Secondary | ICD-10-CM

## 2021-08-25 DIAGNOSIS — I10 Essential (primary) hypertension: Secondary | ICD-10-CM

## 2021-09-20 ENCOUNTER — Other Ambulatory Visit: Payer: Self-pay | Admitting: Cardiovascular Disease

## 2021-09-20 DIAGNOSIS — I10 Essential (primary) hypertension: Secondary | ICD-10-CM

## 2021-09-20 DIAGNOSIS — Z79899 Other long term (current) drug therapy: Secondary | ICD-10-CM

## 2021-09-24 DIAGNOSIS — I10 Essential (primary) hypertension: Secondary | ICD-10-CM | POA: Diagnosis not present

## 2021-09-24 DIAGNOSIS — Z0000 Encounter for general adult medical examination without abnormal findings: Secondary | ICD-10-CM | POA: Diagnosis not present

## 2021-09-24 DIAGNOSIS — E785 Hyperlipidemia, unspecified: Secondary | ICD-10-CM | POA: Diagnosis not present

## 2021-09-24 DIAGNOSIS — M810 Age-related osteoporosis without current pathological fracture: Secondary | ICD-10-CM | POA: Diagnosis not present

## 2021-09-30 ENCOUNTER — Other Ambulatory Visit: Payer: Self-pay | Admitting: Internal Medicine

## 2021-09-30 DIAGNOSIS — I700 Atherosclerosis of aorta: Secondary | ICD-10-CM | POA: Diagnosis not present

## 2021-09-30 DIAGNOSIS — Z1331 Encounter for screening for depression: Secondary | ICD-10-CM | POA: Diagnosis not present

## 2021-09-30 DIAGNOSIS — Z1339 Encounter for screening examination for other mental health and behavioral disorders: Secondary | ICD-10-CM | POA: Diagnosis not present

## 2021-09-30 DIAGNOSIS — M25551 Pain in right hip: Secondary | ICD-10-CM | POA: Diagnosis not present

## 2021-09-30 DIAGNOSIS — E785 Hyperlipidemia, unspecified: Secondary | ICD-10-CM | POA: Diagnosis not present

## 2021-09-30 DIAGNOSIS — K5790 Diverticulosis of intestine, part unspecified, without perforation or abscess without bleeding: Secondary | ICD-10-CM | POA: Diagnosis not present

## 2021-09-30 DIAGNOSIS — R82998 Other abnormal findings in urine: Secondary | ICD-10-CM | POA: Diagnosis not present

## 2021-09-30 DIAGNOSIS — I10 Essential (primary) hypertension: Secondary | ICD-10-CM | POA: Diagnosis not present

## 2021-09-30 DIAGNOSIS — Z1231 Encounter for screening mammogram for malignant neoplasm of breast: Secondary | ICD-10-CM

## 2021-09-30 DIAGNOSIS — Z0000 Encounter for general adult medical examination without abnormal findings: Secondary | ICD-10-CM | POA: Diagnosis not present

## 2021-09-30 DIAGNOSIS — I2729 Other secondary pulmonary hypertension: Secondary | ICD-10-CM | POA: Diagnosis not present

## 2021-09-30 DIAGNOSIS — I2510 Atherosclerotic heart disease of native coronary artery without angina pectoris: Secondary | ICD-10-CM | POA: Diagnosis not present

## 2021-10-03 IMAGING — MG DIGITAL SCREENING BILAT W/ TOMO W/ CAD
6 of 10 series · 6 of 30 positions shown · non-contrast
Comparison: Previous exam(s).

CLINICAL DATA: Screening.

EXAM:
DIGITAL SCREENING BILATERAL MAMMOGRAM WITH TOMO AND CAD

[R CC synth-2D (1 of 2)]
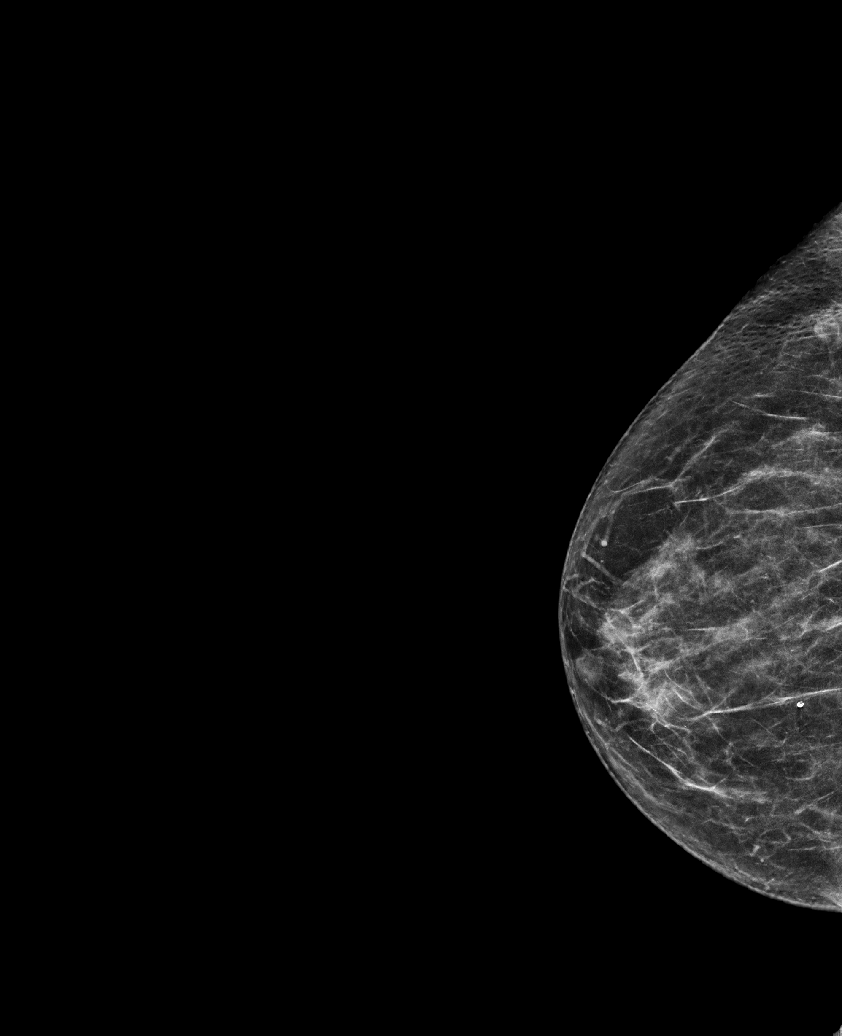

[L CC synth-2D]
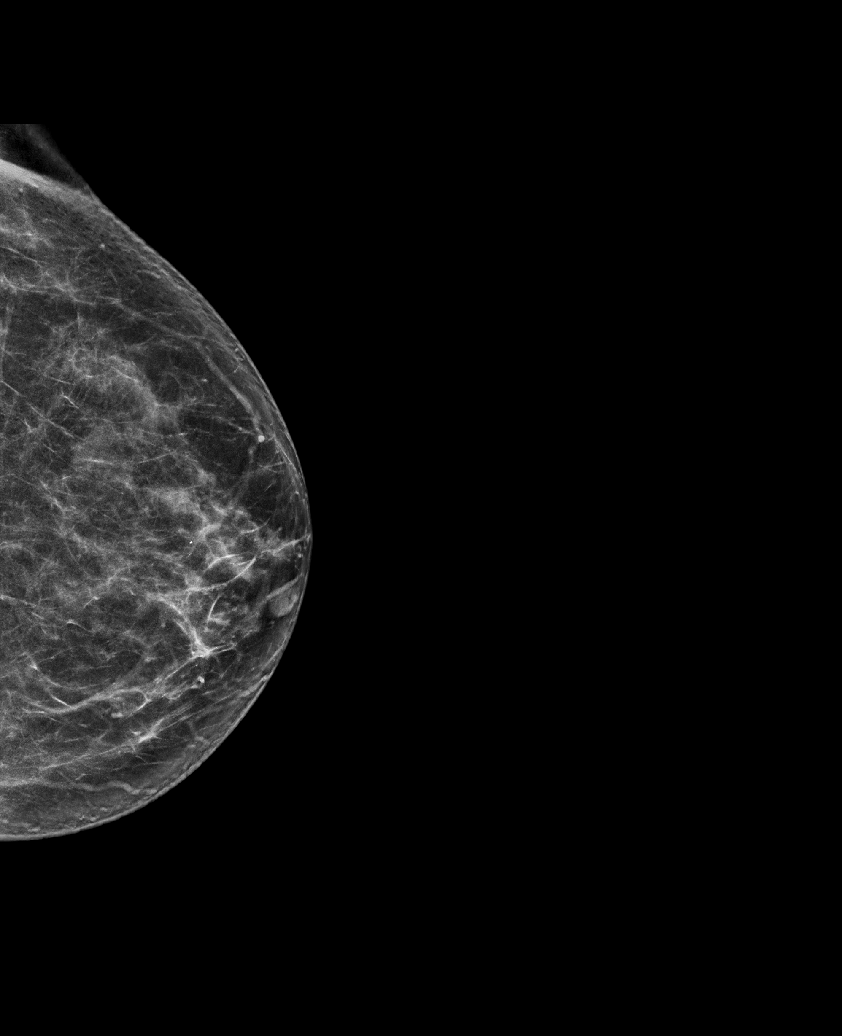

[L MLO synth-2D]
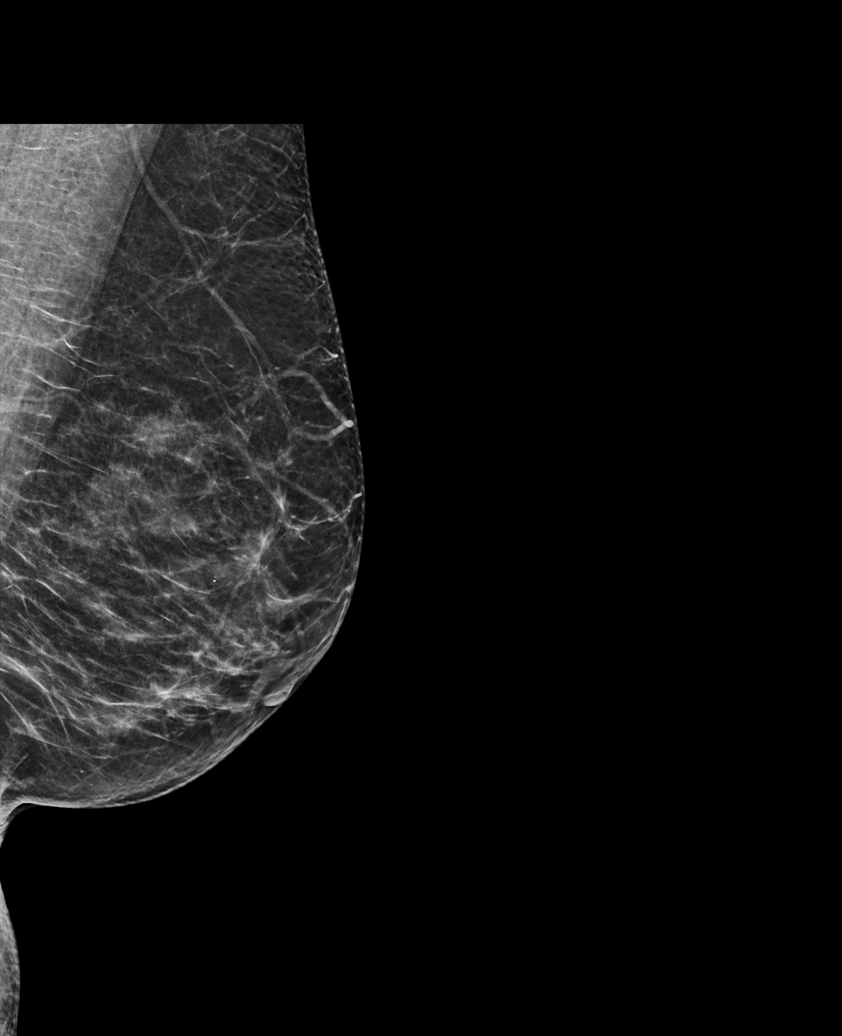

[R MLO synth-2D]
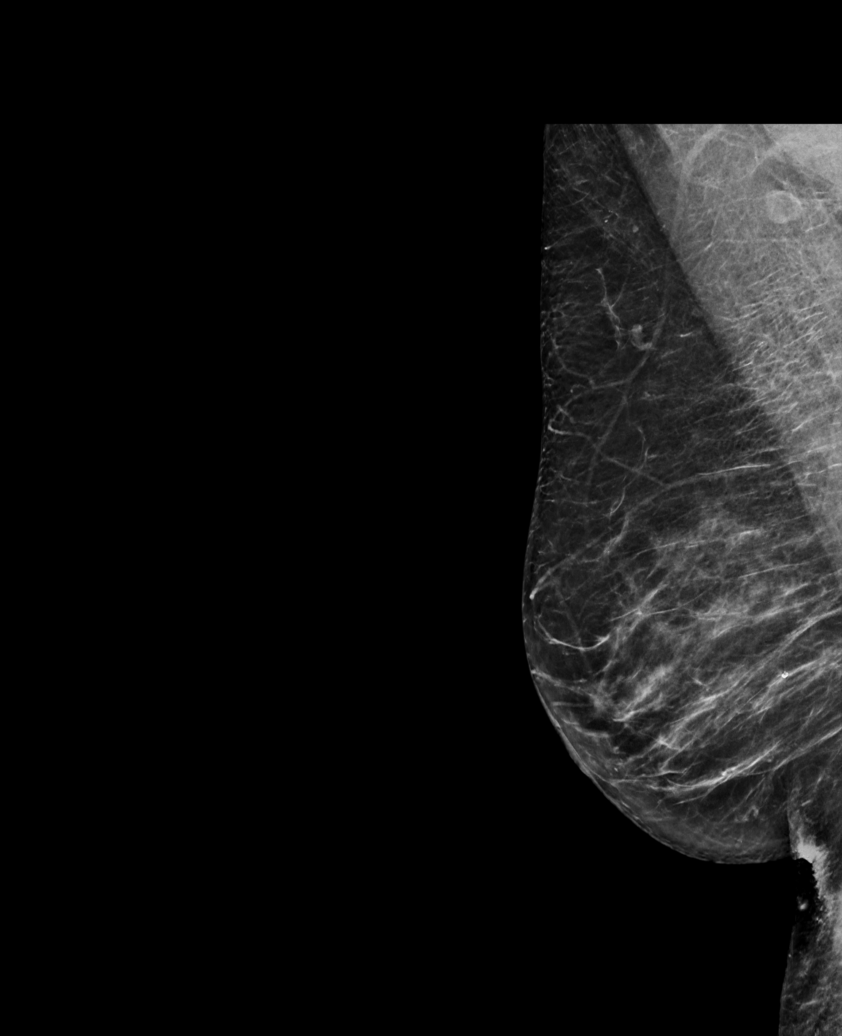

[R CC synth-2D (2 of 2)]
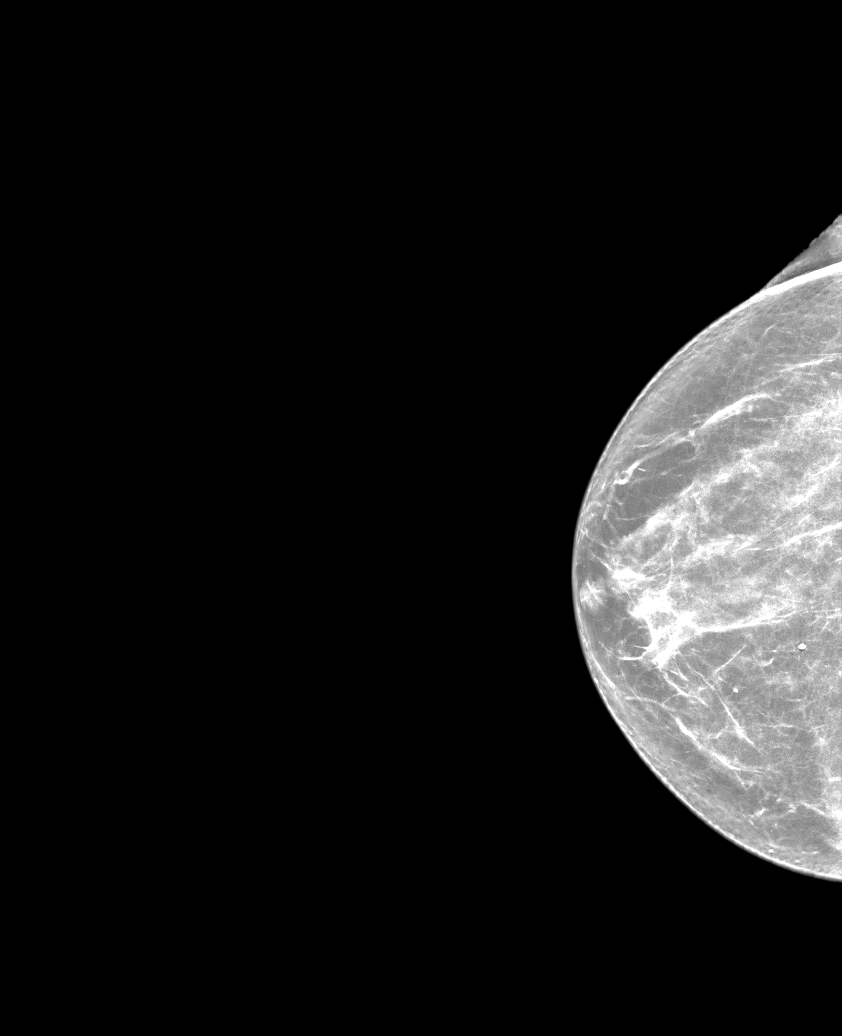

[R MLO tomo · tomo slice 37/73.0]
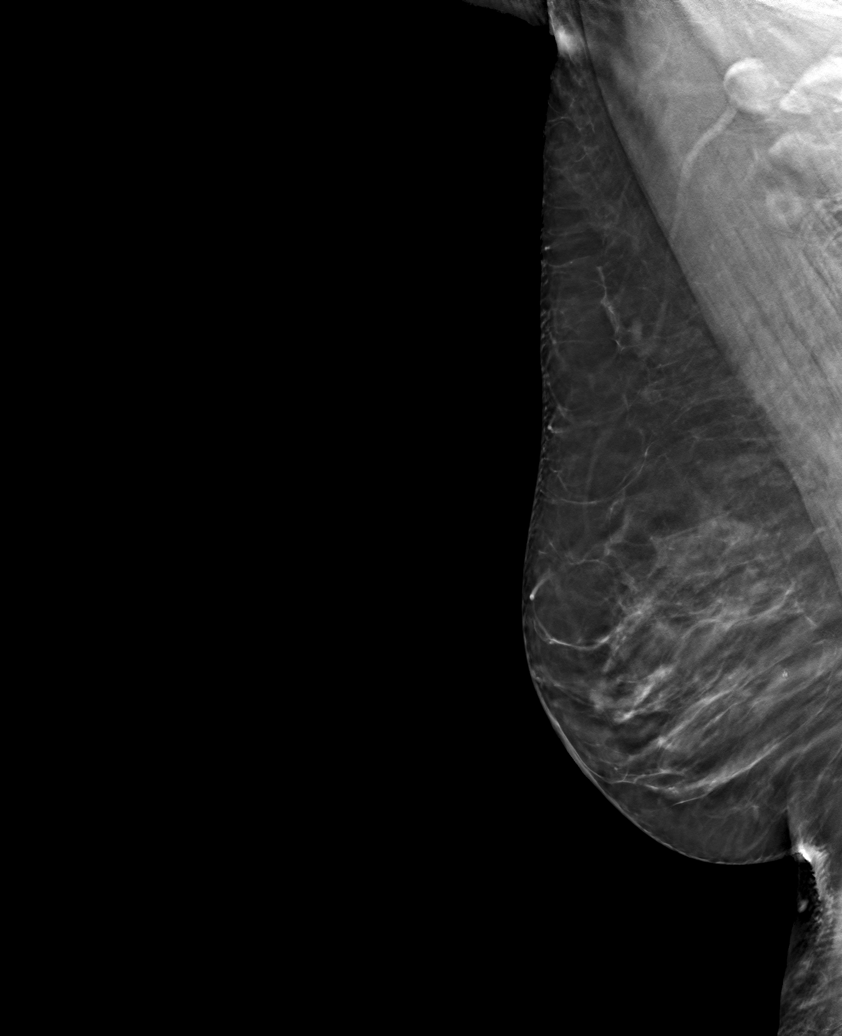

[6 of 30 positions shown; findings below may reference images not displayed]

ACR Breast Density Category b: There are scattered areas of
fibroglandular density.
FINDINGS: There are no findings suspicious for malignancy. Images were
processed with CAD.
IMPRESSION: No mammographic evidence of malignancy. A result letter of this
screening mammogram will be mailed directly to the patient.

RECOMMENDATION:
Screening mammogram in one year. (Code:[id number])

BI-RADS CATEGORY  1: Negative.

## 2021-10-22 ENCOUNTER — Ambulatory Visit
Admission: RE | Admit: 2021-10-22 | Discharge: 2021-10-22 | Disposition: A | Discharge status: Home / Self Care | Payer: Medicare PPO | Source: Ambulatory Visit | Attending: Internal Medicine | Admitting: Internal Medicine

## 2021-10-22 DIAGNOSIS — Z1231 Encounter for screening mammogram for malignant neoplasm of breast: Secondary | ICD-10-CM

## 2021-11-06 DIAGNOSIS — M25561 Pain in right knee: Secondary | ICD-10-CM | POA: Diagnosis not present

## 2021-11-06 DIAGNOSIS — M791 Myalgia, unspecified site: Secondary | ICD-10-CM | POA: Diagnosis not present

## 2021-11-06 DIAGNOSIS — Z23 Encounter for immunization: Secondary | ICD-10-CM | POA: Diagnosis not present

## 2021-11-06 DIAGNOSIS — Z79899 Other long term (current) drug therapy: Secondary | ICD-10-CM | POA: Diagnosis not present

## 2021-11-14 DIAGNOSIS — H5203 Hypermetropia, bilateral: Secondary | ICD-10-CM | POA: Diagnosis not present

## 2021-11-14 DIAGNOSIS — H524 Presbyopia: Secondary | ICD-10-CM | POA: Diagnosis not present

## 2021-11-14 DIAGNOSIS — H25013 Cortical age-related cataract, bilateral: Secondary | ICD-10-CM | POA: Diagnosis not present

## 2021-11-18 DIAGNOSIS — I10 Essential (primary) hypertension: Secondary | ICD-10-CM | POA: Diagnosis not present

## 2021-11-21 DIAGNOSIS — I10 Essential (primary) hypertension: Secondary | ICD-10-CM | POA: Diagnosis not present

## 2021-12-11 DIAGNOSIS — Z85828 Personal history of other malignant neoplasm of skin: Secondary | ICD-10-CM | POA: Diagnosis not present

## 2021-12-11 DIAGNOSIS — L570 Actinic keratosis: Secondary | ICD-10-CM | POA: Diagnosis not present

## 2021-12-11 DIAGNOSIS — D235 Other benign neoplasm of skin of trunk: Secondary | ICD-10-CM | POA: Diagnosis not present

## 2021-12-11 DIAGNOSIS — D2272 Melanocytic nevi of left lower limb, including hip: Secondary | ICD-10-CM | POA: Diagnosis not present

## 2021-12-11 DIAGNOSIS — D224 Melanocytic nevi of scalp and neck: Secondary | ICD-10-CM | POA: Diagnosis not present

## 2021-12-11 DIAGNOSIS — L814 Other melanin hyperpigmentation: Secondary | ICD-10-CM | POA: Diagnosis not present

## 2021-12-11 DIAGNOSIS — L821 Other seborrheic keratosis: Secondary | ICD-10-CM | POA: Diagnosis not present

## 2021-12-11 DIAGNOSIS — D0361 Melanoma in situ of right upper limb, including shoulder: Secondary | ICD-10-CM | POA: Diagnosis not present

## 2021-12-16 ENCOUNTER — Ambulatory Visit: Payer: Medicare PPO | Admitting: Cardiovascular Disease

## 2021-12-16 ENCOUNTER — Encounter: Payer: Self-pay | Admitting: Cardiovascular Disease

## 2021-12-16 VITALS — BP 138/72 | HR 55 | Ht 65.0 in | Wt 158.2 lb

## 2021-12-16 DIAGNOSIS — E782 Mixed hyperlipidemia: Secondary | ICD-10-CM

## 2021-12-16 DIAGNOSIS — I2510 Atherosclerotic heart disease of native coronary artery without angina pectoris: Secondary | ICD-10-CM | POA: Diagnosis not present

## 2021-12-16 NOTE — Progress Notes (Signed)
Cardiology Office Note   Date:  12/16/2021   ID:  Carla, Huber Nov 12, 1945, MRN [id number]  PCP:  Rodrigo Ran, MD  Cardiologist:   Kristeen Miss, MD   No chief complaint on file.  Problem List 1. CAD - coronary calcifications noted by CT scan 2. Palpitations 3.    History of Present Illness: 07/26/2015  Carla Huber is a 76 y.o. female who presents for further evaluation of some coronary calcifications noted on CT scan . Has had lots of asthma like issues / dyspnea with exertion. CT of the lungs was done for further eval.   Was noted to have coronary calcification in the LAD and RCA region.  Still very active.  Rare episode of CP  She fractured her sternum several years ago and she is not sure if that is what is causing her CP.  Both parents had heart issues. - father had a MI , mother had CHF  Sep 19, 2019:  Carla Huber is seen after a 4 year absence  She has a hx of coronary calcifications Coronary calcium score of 379. This was 85th percentile for age and sex matched control.   She has been having some dyspnea  And upper left chest tightness.  Can occur spontanusly . 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  She notes that all of the symptoms have improved slightly since she had her blood pressure medications increased.  Her blood pressure is still a little bit elevated. Still eats some salty foods.   No syncope or presyncope   Has a history of hyperlipidemia.  She was previously on Crestor 5 mg a day but started having leg cramps.  She stopped her Crestor at this time.  December 25, 2019: [name] is seen today for follow-up of her coronary artery calcifications. Coronary CT angiogram from October 20, 2019 reveals a large RCA with mild disease. The LAD is a large vessel with has moderate plaque in the 50 to 69% range.  The circumflex artery is a small nondominant vessel and is normal. FFR  evaluation revealed no significant flow-limiting lesions.  She is exercising regularly.  Walks 3 miles a day  No cp  Gave her a exercise target HR of 125 - 130  We discussed repatha, / pralulent  She has tried crestor and lipitor - developed leg pain .   December 16, 2021:  [name] is seen today for follow-up of her coronary artery calcifications.  Coronary CT angiogram from October 20, 2019 reveals a large right coronary artery with mild disease.  The LAD has moderate plaque in the mid vessel.  Left circumflex artery is small and nondominant and is relatively normal.  Stopped taking the Repatha due to leg cramping  Dr. Waynard Edwards has stopped the Repatha for several months ( I agree with this )  Doing well  Walks 1.5 - 2 miles a day ,  does yoga  Occasionally has mild l shoulder pain with  walking .  She will continue walking and see if it happens again .   Past Medical History:  Diagnosis Date   Coronary artery calcification 07/26/2015   Cough 02/21/2015   Dyspnea 02/21/2015   Exposure to respiratory irritant 02/21/2015   Insomnia    Palpitations 05/09/2014    Past Surgical History:  Procedure Laterality Date   APPENDECTOMY  1970     Current  Outpatient Medications  Medication Sig Dispense Refill   aspirin EC 81 MG tablet Take 1 tablet (81 mg total) by mouth daily.     b complex vitamins tablet Take 1 tablet by mouth daily.     cyclobenzaprine (FLEXERIL) 5 MG tablet daily as needed.     Fish Oil-Cholecalciferol (FISH OIL + D3) 1000-1000 MG-UNIT CAPS Take 1,000 mg by mouth daily.     magnesium oxide (MAG-OX) 400 MG tablet Take 400 mg by mouth daily.     telmisartan (MICARDIS) 80 MG tablet Take 1 tablet (80 mg total) by mouth daily. 15 tablet 0   traZODone (DESYREL) 100 MG tablet Take 1 tablet by mouth daily.     vitamin C (ASCORBIC ACID) 250 MG tablet Take 250 mg by mouth daily.     VITAMIN D PO Take 1,000 mg by mouth daily.     amLODipine (NORVASC) 2.5 MG tablet  (Patient not taking:  Reported on 12/19/2020)     nebivolol (BYSTOLIC) 5 MG tablet Take 5 mg by mouth at bedtime.     nitroGLYCERIN (NITROSTAT) 0.4 MG SL tablet Place 1 tablet (0.4 mg total) under the tongue every 5 (five) minutes as needed for chest pain. 25 tablet 6   REPATHA SURECLICK 140 MG/ML SOAJ INJECT 140 MG INTO THE SKIN EVERY 14 DAYS. (Patient not taking: Reported on 12/16/2021) 2 mL 11   No current facility-administered medications for this visit.    Allergies:   Amlodipine besylate, Hydrochlorothiazide, Other, Statins, and Thiazide-type diuretics    Social History:  The patient  reports that she has never smoked. She has never used smokeless tobacco. She reports current alcohol use. She reports that she does not use drugs.   Family History:  The patient's family history includes Breast cancer in her paternal aunt; Heart attack in her father, maternal grandfather, maternal grandmother, paternal grandfather, and paternal grandmother; Heart failure in her mother; Lymphoma in her sister; Uterine cancer in her mother.    ROS:  Please see the history of present illness.     All other systems are reviewed and negative.     Physical Exam: Blood pressure 138/72, pulse (!) 55, height 5\' 5"  (1.651 m), weight 158 lb 3.2 oz (71.8 kg), SpO2 96 %.       GEN:  Well nourished, well developed in no acute distress HEENT: Normal NECK: No JVD; No carotid bruits LYMPHATICS: No lymphadenopathy CARDIAC: RRR , no murmurs, rubs, gallops RESPIRATORY:  Clear to auscultation without rales, wheezing or rhonchi  ABDOMEN: Soft, non-tender, non-distended MUSCULOSKELETAL:  No edema; No deformity  SKIN: Warm and dry NEUROLOGIC:  Alert and oriented x 3    EKG:   December 16, 2021: Sinus bradycardia 55.  First-degree AV block.  Possible anterior septal myocardial infarction/poor R wave progression.  Recent Labs: 12/19/2020: ALT 14; BUN 14; Creatinine, Ser 1.04; Potassium 4.6; Sodium 137    Lipid Panel    Component Value  Date/Time   CHOL 154 03/27/2020 0953   TRIG 28 03/27/2020 0953   HDL 88 03/27/2020 0953   CHOLHDL 1.8 03/27/2020 0953   LDLCALC 58 03/27/2020 0953      Wt Readings from Last 3 Encounters:  12/16/21 158 lb 3.2 oz (71.8 kg)  12/19/20 154 lb 9.6 oz (70.1 kg)  03/25/20 140 lb (63.5 kg)      Other studies Reviewed: Additional studies/ records that were reviewed today include: . Review of the above records demonstrates:    ASSESSMENT AND PLAN:  1.  Coronary artery calcification Lavora has coronary calcifications and also has mild to moderate coronary artery disease.  She is not really had any symptoms.  She does note having some shoulder pain when she is walking up a hill.  I have asked her to keep an eye on this and to let me know if this becomes a repetitive problem.   2. Essential HTN:      Blood pressure seems to be well controlled.  3.  Hyperlipidemia: She has been on Repatha but started having lots of muscle aches.  Repatha is now on hold for the next several weeks to see if these muscle pains resolved. If she does not tolerate the Repatha have suggested that she try inclisiran.  Our lipid clinic can help arrange for her to get that.  It is also likely that Wynne Dust at [facility] is able to arrange for her to get inclisiran as well.  I will plan on seeing her again in 1 year.   I will see her in 1 year. Current medicines are reviewed at length with the patient today.  The patient does not have concerns regarding medicines.  The following changes have been made:  no change  Labs/ tests ordered today include:   Orders Placed This Encounter  Procedures   EKG 12-Lead     Disposition:      Kristeen Miss, MD  12/16/2021 6:42 PM    [facility] [address] [place], [place], [place]  [address] Phone: [phone]; Fax: [phone]

## 2021-12-16 NOTE — Patient Instructions (Addendum)
Consider trying Inclisiran if you dont tolerate the PCSK9 inhibitors (repatha, pralulent)   Medication Instructions:  Your physician recommends that you continue on your current medications as directed. Please refer to the Current Medication list given to you today.  *If you need a refill on your cardiac medications before your next appointment, please call your pharmacy*   Lab Work: NONE If you have labs (blood work) drawn today and your tests are completely normal, you will receive your results only by: MyChart Message (if you have MyChart) OR A paper copy in the mail If you have any lab test that is abnormal or we need to change your treatment, we will call you to review the results.   Testing/Procedures: NONE   Follow-Up: At [facility], you and your health needs are our priority.  As part of our continuing mission to provide you with exceptional heart care, we have created designated Provider Care Teams.  These Care Teams include your primary Cardiologist (physician) and Advanced Practice Providers (APPs -  Physician Assistants and Nurse Practitioners) who all work together to provide you with the care you need, when you need it.  We recommend signing up for the patient portal called "MyChart".  Sign up information is provided on this After Visit Summary.  MyChart is used to connect with patients for Virtual Visits (Telemedicine).  Patients are able to view lab/test results, encounter notes, upcoming appointments, etc.  Non-urgent messages can be sent to your provider as well.   To learn more about what you can do with MyChart, go to [email or web address].    Your next appointment:   1 year(s)  The format for your next appointment:   In Person  Provider:   Kristeen Miss, MD      Important Information About Sugar

## 2021-12-18 DIAGNOSIS — M5459 Other low back pain: Secondary | ICD-10-CM | POA: Diagnosis not present

## 2021-12-18 DIAGNOSIS — M25551 Pain in right hip: Secondary | ICD-10-CM | POA: Diagnosis not present

## 2021-12-18 DIAGNOSIS — M25552 Pain in left hip: Secondary | ICD-10-CM | POA: Diagnosis not present

## 2021-12-23 DIAGNOSIS — Z85828 Personal history of other malignant neoplasm of skin: Secondary | ICD-10-CM | POA: Diagnosis not present

## 2021-12-23 DIAGNOSIS — D0361 Melanoma in situ of right upper limb, including shoulder: Secondary | ICD-10-CM | POA: Diagnosis not present

## 2021-12-30 DIAGNOSIS — M5416 Radiculopathy, lumbar region: Secondary | ICD-10-CM | POA: Diagnosis not present

## 2022-02-02 DIAGNOSIS — M5416 Radiculopathy, lumbar region: Secondary | ICD-10-CM | POA: Diagnosis not present

## 2022-02-02 DIAGNOSIS — M48062 Spinal stenosis, lumbar region with neurogenic claudication: Secondary | ICD-10-CM | POA: Diagnosis not present

## 2022-02-14 DIAGNOSIS — R269 Unspecified abnormalities of gait and mobility: Secondary | ICD-10-CM | POA: Diagnosis not present

## 2022-02-14 DIAGNOSIS — M48062 Spinal stenosis, lumbar region with neurogenic claudication: Secondary | ICD-10-CM | POA: Diagnosis not present

## 2022-02-14 DIAGNOSIS — M79604 Pain in right leg: Secondary | ICD-10-CM | POA: Diagnosis not present

## 2022-02-14 DIAGNOSIS — M79605 Pain in left leg: Secondary | ICD-10-CM | POA: Diagnosis not present

## 2022-03-03 DIAGNOSIS — M79604 Pain in right leg: Secondary | ICD-10-CM | POA: Diagnosis not present

## 2022-03-03 DIAGNOSIS — R269 Unspecified abnormalities of gait and mobility: Secondary | ICD-10-CM | POA: Diagnosis not present

## 2022-03-03 DIAGNOSIS — M79605 Pain in left leg: Secondary | ICD-10-CM | POA: Diagnosis not present

## 2022-03-03 DIAGNOSIS — M48062 Spinal stenosis, lumbar region with neurogenic claudication: Secondary | ICD-10-CM | POA: Diagnosis not present

## 2022-03-13 DIAGNOSIS — M79605 Pain in left leg: Secondary | ICD-10-CM | POA: Diagnosis not present

## 2022-03-13 DIAGNOSIS — R269 Unspecified abnormalities of gait and mobility: Secondary | ICD-10-CM | POA: Diagnosis not present

## 2022-03-13 DIAGNOSIS — M48062 Spinal stenosis, lumbar region with neurogenic claudication: Secondary | ICD-10-CM | POA: Diagnosis not present

## 2022-03-13 DIAGNOSIS — M79604 Pain in right leg: Secondary | ICD-10-CM | POA: Diagnosis not present

## 2022-03-26 DIAGNOSIS — M79604 Pain in right leg: Secondary | ICD-10-CM | POA: Diagnosis not present

## 2022-03-26 DIAGNOSIS — M79605 Pain in left leg: Secondary | ICD-10-CM | POA: Diagnosis not present

## 2022-03-26 DIAGNOSIS — M48062 Spinal stenosis, lumbar region with neurogenic claudication: Secondary | ICD-10-CM | POA: Diagnosis not present

## 2022-03-26 DIAGNOSIS — R269 Unspecified abnormalities of gait and mobility: Secondary | ICD-10-CM | POA: Diagnosis not present

## 2022-04-24 DIAGNOSIS — M48062 Spinal stenosis, lumbar region with neurogenic claudication: Secondary | ICD-10-CM | POA: Diagnosis not present

## 2022-04-24 DIAGNOSIS — M79604 Pain in right leg: Secondary | ICD-10-CM | POA: Diagnosis not present

## 2022-04-24 DIAGNOSIS — R269 Unspecified abnormalities of gait and mobility: Secondary | ICD-10-CM | POA: Diagnosis not present

## 2022-04-24 DIAGNOSIS — M79605 Pain in left leg: Secondary | ICD-10-CM | POA: Diagnosis not present

## 2022-05-04 ENCOUNTER — Other Ambulatory Visit (HOSPITAL_COMMUNITY): Payer: Self-pay

## 2022-05-05 DIAGNOSIS — M79604 Pain in right leg: Secondary | ICD-10-CM | POA: Diagnosis not present

## 2022-05-05 DIAGNOSIS — M48062 Spinal stenosis, lumbar region with neurogenic claudication: Secondary | ICD-10-CM | POA: Diagnosis not present

## 2022-05-05 DIAGNOSIS — R269 Unspecified abnormalities of gait and mobility: Secondary | ICD-10-CM | POA: Diagnosis not present

## 2022-05-05 DIAGNOSIS — M79605 Pain in left leg: Secondary | ICD-10-CM | POA: Diagnosis not present

## 2022-05-19 DIAGNOSIS — M48062 Spinal stenosis, lumbar region with neurogenic claudication: Secondary | ICD-10-CM | POA: Diagnosis not present

## 2022-05-19 DIAGNOSIS — R269 Unspecified abnormalities of gait and mobility: Secondary | ICD-10-CM | POA: Diagnosis not present

## 2022-05-19 DIAGNOSIS — M79605 Pain in left leg: Secondary | ICD-10-CM | POA: Diagnosis not present

## 2022-05-19 DIAGNOSIS — M79604 Pain in right leg: Secondary | ICD-10-CM | POA: Diagnosis not present

## 2022-05-20 DIAGNOSIS — M9901 Segmental and somatic dysfunction of cervical region: Secondary | ICD-10-CM | POA: Diagnosis not present

## 2022-05-20 DIAGNOSIS — M62830 Muscle spasm of back: Secondary | ICD-10-CM | POA: Diagnosis not present

## 2022-05-20 DIAGNOSIS — M9902 Segmental and somatic dysfunction of thoracic region: Secondary | ICD-10-CM | POA: Diagnosis not present

## 2022-05-20 DIAGNOSIS — M50922 Unspecified cervical disc disorder at C5-C6 level: Secondary | ICD-10-CM | POA: Diagnosis not present

## 2022-05-27 DIAGNOSIS — M9902 Segmental and somatic dysfunction of thoracic region: Secondary | ICD-10-CM | POA: Diagnosis not present

## 2022-05-27 DIAGNOSIS — M50922 Unspecified cervical disc disorder at C5-C6 level: Secondary | ICD-10-CM | POA: Diagnosis not present

## 2022-05-27 DIAGNOSIS — M9901 Segmental and somatic dysfunction of cervical region: Secondary | ICD-10-CM | POA: Diagnosis not present

## 2022-05-27 DIAGNOSIS — M62830 Muscle spasm of back: Secondary | ICD-10-CM | POA: Diagnosis not present

## 2022-06-02 DIAGNOSIS — M48062 Spinal stenosis, lumbar region with neurogenic claudication: Secondary | ICD-10-CM | POA: Diagnosis not present

## 2022-06-02 DIAGNOSIS — R269 Unspecified abnormalities of gait and mobility: Secondary | ICD-10-CM | POA: Diagnosis not present

## 2022-06-02 DIAGNOSIS — M79605 Pain in left leg: Secondary | ICD-10-CM | POA: Diagnosis not present

## 2022-06-02 DIAGNOSIS — M79604 Pain in right leg: Secondary | ICD-10-CM | POA: Diagnosis not present

## 2022-06-10 DIAGNOSIS — M9901 Segmental and somatic dysfunction of cervical region: Secondary | ICD-10-CM | POA: Diagnosis not present

## 2022-06-10 DIAGNOSIS — M62830 Muscle spasm of back: Secondary | ICD-10-CM | POA: Diagnosis not present

## 2022-06-10 DIAGNOSIS — M9902 Segmental and somatic dysfunction of thoracic region: Secondary | ICD-10-CM | POA: Diagnosis not present

## 2022-06-10 DIAGNOSIS — M50922 Unspecified cervical disc disorder at C5-C6 level: Secondary | ICD-10-CM | POA: Diagnosis not present

## 2022-06-16 DIAGNOSIS — M79605 Pain in left leg: Secondary | ICD-10-CM | POA: Diagnosis not present

## 2022-06-16 DIAGNOSIS — M79604 Pain in right leg: Secondary | ICD-10-CM | POA: Diagnosis not present

## 2022-06-16 DIAGNOSIS — R269 Unspecified abnormalities of gait and mobility: Secondary | ICD-10-CM | POA: Diagnosis not present

## 2022-06-16 DIAGNOSIS — M48062 Spinal stenosis, lumbar region with neurogenic claudication: Secondary | ICD-10-CM | POA: Diagnosis not present

## 2022-06-24 DIAGNOSIS — M50922 Unspecified cervical disc disorder at C5-C6 level: Secondary | ICD-10-CM | POA: Diagnosis not present

## 2022-06-24 DIAGNOSIS — M9901 Segmental and somatic dysfunction of cervical region: Secondary | ICD-10-CM | POA: Diagnosis not present

## 2022-06-24 DIAGNOSIS — M9902 Segmental and somatic dysfunction of thoracic region: Secondary | ICD-10-CM | POA: Diagnosis not present

## 2022-06-24 DIAGNOSIS — M62830 Muscle spasm of back: Secondary | ICD-10-CM | POA: Diagnosis not present

## 2022-07-03 DIAGNOSIS — Z1152 Encounter for screening for COVID-19: Secondary | ICD-10-CM | POA: Diagnosis not present

## 2022-07-03 DIAGNOSIS — J029 Acute pharyngitis, unspecified: Secondary | ICD-10-CM | POA: Diagnosis not present

## 2022-07-03 DIAGNOSIS — R509 Fever, unspecified: Secondary | ICD-10-CM | POA: Diagnosis not present

## 2022-07-03 DIAGNOSIS — J209 Acute bronchitis, unspecified: Secondary | ICD-10-CM | POA: Diagnosis not present

## 2022-07-03 DIAGNOSIS — R0981 Nasal congestion: Secondary | ICD-10-CM | POA: Diagnosis not present

## 2022-07-03 DIAGNOSIS — J45909 Unspecified asthma, uncomplicated: Secondary | ICD-10-CM | POA: Diagnosis not present

## 2022-07-03 DIAGNOSIS — J101 Influenza due to other identified influenza virus with other respiratory manifestations: Secondary | ICD-10-CM | POA: Diagnosis not present

## 2022-07-03 DIAGNOSIS — R051 Acute cough: Secondary | ICD-10-CM | POA: Diagnosis not present

## 2022-07-07 DIAGNOSIS — M48062 Spinal stenosis, lumbar region with neurogenic claudication: Secondary | ICD-10-CM | POA: Diagnosis not present

## 2022-07-07 DIAGNOSIS — M79604 Pain in right leg: Secondary | ICD-10-CM | POA: Diagnosis not present

## 2022-07-07 DIAGNOSIS — R269 Unspecified abnormalities of gait and mobility: Secondary | ICD-10-CM | POA: Diagnosis not present

## 2022-07-07 DIAGNOSIS — M79605 Pain in left leg: Secondary | ICD-10-CM | POA: Diagnosis not present

## 2022-07-08 DIAGNOSIS — M9902 Segmental and somatic dysfunction of thoracic region: Secondary | ICD-10-CM | POA: Diagnosis not present

## 2022-07-08 DIAGNOSIS — M62830 Muscle spasm of back: Secondary | ICD-10-CM | POA: Diagnosis not present

## 2022-07-08 DIAGNOSIS — M50922 Unspecified cervical disc disorder at C5-C6 level: Secondary | ICD-10-CM | POA: Diagnosis not present

## 2022-07-08 DIAGNOSIS — M9901 Segmental and somatic dysfunction of cervical region: Secondary | ICD-10-CM | POA: Diagnosis not present

## 2022-07-16 DIAGNOSIS — L814 Other melanin hyperpigmentation: Secondary | ICD-10-CM | POA: Diagnosis not present

## 2022-07-16 DIAGNOSIS — L738 Other specified follicular disorders: Secondary | ICD-10-CM | POA: Diagnosis not present

## 2022-07-16 DIAGNOSIS — L905 Scar conditions and fibrosis of skin: Secondary | ICD-10-CM | POA: Diagnosis not present

## 2022-07-16 DIAGNOSIS — L821 Other seborrheic keratosis: Secondary | ICD-10-CM | POA: Diagnosis not present

## 2022-07-16 DIAGNOSIS — D2272 Melanocytic nevi of left lower limb, including hip: Secondary | ICD-10-CM | POA: Diagnosis not present

## 2022-07-16 DIAGNOSIS — L570 Actinic keratosis: Secondary | ICD-10-CM | POA: Diagnosis not present

## 2022-07-16 DIAGNOSIS — Z85820 Personal history of malignant melanoma of skin: Secondary | ICD-10-CM | POA: Diagnosis not present

## 2022-07-16 DIAGNOSIS — D485 Neoplasm of uncertain behavior of skin: Secondary | ICD-10-CM | POA: Diagnosis not present

## 2022-07-16 DIAGNOSIS — D2262 Melanocytic nevi of left upper limb, including shoulder: Secondary | ICD-10-CM | POA: Diagnosis not present

## 2022-07-16 DIAGNOSIS — D1801 Hemangioma of skin and subcutaneous tissue: Secondary | ICD-10-CM | POA: Diagnosis not present

## 2022-07-16 DIAGNOSIS — Z85828 Personal history of other malignant neoplasm of skin: Secondary | ICD-10-CM | POA: Diagnosis not present

## 2022-07-22 DIAGNOSIS — M5117 Intervertebral disc disorders with radiculopathy, lumbosacral region: Secondary | ICD-10-CM | POA: Diagnosis not present

## 2022-07-22 DIAGNOSIS — M62830 Muscle spasm of back: Secondary | ICD-10-CM | POA: Diagnosis not present

## 2022-07-22 DIAGNOSIS — M9902 Segmental and somatic dysfunction of thoracic region: Secondary | ICD-10-CM | POA: Diagnosis not present

## 2022-07-22 DIAGNOSIS — M9901 Segmental and somatic dysfunction of cervical region: Secondary | ICD-10-CM | POA: Diagnosis not present

## 2022-07-22 DIAGNOSIS — M50922 Unspecified cervical disc disorder at C5-C6 level: Secondary | ICD-10-CM | POA: Diagnosis not present

## 2022-07-22 DIAGNOSIS — M9903 Segmental and somatic dysfunction of lumbar region: Secondary | ICD-10-CM | POA: Diagnosis not present

## 2022-07-23 DIAGNOSIS — M79605 Pain in left leg: Secondary | ICD-10-CM | POA: Diagnosis not present

## 2022-07-23 DIAGNOSIS — M48062 Spinal stenosis, lumbar region with neurogenic claudication: Secondary | ICD-10-CM | POA: Diagnosis not present

## 2022-07-23 DIAGNOSIS — R269 Unspecified abnormalities of gait and mobility: Secondary | ICD-10-CM | POA: Diagnosis not present

## 2022-07-23 DIAGNOSIS — M79604 Pain in right leg: Secondary | ICD-10-CM | POA: Diagnosis not present

## 2022-07-29 DIAGNOSIS — M5117 Intervertebral disc disorders with radiculopathy, lumbosacral region: Secondary | ICD-10-CM | POA: Diagnosis not present

## 2022-07-29 DIAGNOSIS — M9901 Segmental and somatic dysfunction of cervical region: Secondary | ICD-10-CM | POA: Diagnosis not present

## 2022-07-29 DIAGNOSIS — M50922 Unspecified cervical disc disorder at C5-C6 level: Secondary | ICD-10-CM | POA: Diagnosis not present

## 2022-07-29 DIAGNOSIS — M9902 Segmental and somatic dysfunction of thoracic region: Secondary | ICD-10-CM | POA: Diagnosis not present

## 2022-07-29 DIAGNOSIS — M62830 Muscle spasm of back: Secondary | ICD-10-CM | POA: Diagnosis not present

## 2022-07-29 DIAGNOSIS — M9903 Segmental and somatic dysfunction of lumbar region: Secondary | ICD-10-CM | POA: Diagnosis not present

## 2022-08-11 DIAGNOSIS — R269 Unspecified abnormalities of gait and mobility: Secondary | ICD-10-CM | POA: Diagnosis not present

## 2022-08-11 DIAGNOSIS — M79604 Pain in right leg: Secondary | ICD-10-CM | POA: Diagnosis not present

## 2022-08-11 DIAGNOSIS — M79605 Pain in left leg: Secondary | ICD-10-CM | POA: Diagnosis not present

## 2022-08-11 DIAGNOSIS — M48062 Spinal stenosis, lumbar region with neurogenic claudication: Secondary | ICD-10-CM | POA: Diagnosis not present

## 2022-08-25 DIAGNOSIS — M79604 Pain in right leg: Secondary | ICD-10-CM | POA: Diagnosis not present

## 2022-08-25 DIAGNOSIS — R269 Unspecified abnormalities of gait and mobility: Secondary | ICD-10-CM | POA: Diagnosis not present

## 2022-08-25 DIAGNOSIS — M48062 Spinal stenosis, lumbar region with neurogenic claudication: Secondary | ICD-10-CM | POA: Diagnosis not present

## 2022-08-25 DIAGNOSIS — M79605 Pain in left leg: Secondary | ICD-10-CM | POA: Diagnosis not present

## 2022-08-26 DIAGNOSIS — D485 Neoplasm of uncertain behavior of skin: Secondary | ICD-10-CM | POA: Diagnosis not present

## 2022-08-26 DIAGNOSIS — L988 Other specified disorders of the skin and subcutaneous tissue: Secondary | ICD-10-CM | POA: Diagnosis not present

## 2022-08-26 DIAGNOSIS — Z85828 Personal history of other malignant neoplasm of skin: Secondary | ICD-10-CM | POA: Diagnosis not present

## 2022-09-01 DIAGNOSIS — M79605 Pain in left leg: Secondary | ICD-10-CM | POA: Diagnosis not present

## 2022-09-01 DIAGNOSIS — M79604 Pain in right leg: Secondary | ICD-10-CM | POA: Diagnosis not present

## 2022-09-01 DIAGNOSIS — R269 Unspecified abnormalities of gait and mobility: Secondary | ICD-10-CM | POA: Diagnosis not present

## 2022-09-01 DIAGNOSIS — M48062 Spinal stenosis, lumbar region with neurogenic claudication: Secondary | ICD-10-CM | POA: Diagnosis not present

## 2022-09-15 DIAGNOSIS — M79605 Pain in left leg: Secondary | ICD-10-CM | POA: Diagnosis not present

## 2022-09-15 DIAGNOSIS — M79604 Pain in right leg: Secondary | ICD-10-CM | POA: Diagnosis not present

## 2022-09-15 DIAGNOSIS — M48062 Spinal stenosis, lumbar region with neurogenic claudication: Secondary | ICD-10-CM | POA: Diagnosis not present

## 2022-09-15 DIAGNOSIS — R269 Unspecified abnormalities of gait and mobility: Secondary | ICD-10-CM | POA: Diagnosis not present

## 2022-09-16 DIAGNOSIS — M62830 Muscle spasm of back: Secondary | ICD-10-CM | POA: Diagnosis not present

## 2022-09-16 DIAGNOSIS — M9902 Segmental and somatic dysfunction of thoracic region: Secondary | ICD-10-CM | POA: Diagnosis not present

## 2022-09-16 DIAGNOSIS — M9903 Segmental and somatic dysfunction of lumbar region: Secondary | ICD-10-CM | POA: Diagnosis not present

## 2022-09-16 DIAGNOSIS — M5117 Intervertebral disc disorders with radiculopathy, lumbosacral region: Secondary | ICD-10-CM | POA: Diagnosis not present

## 2022-09-16 DIAGNOSIS — M50922 Unspecified cervical disc disorder at C5-C6 level: Secondary | ICD-10-CM | POA: Diagnosis not present

## 2022-09-16 DIAGNOSIS — M9901 Segmental and somatic dysfunction of cervical region: Secondary | ICD-10-CM | POA: Diagnosis not present

## 2022-09-30 DIAGNOSIS — M50922 Unspecified cervical disc disorder at C5-C6 level: Secondary | ICD-10-CM | POA: Diagnosis not present

## 2022-09-30 DIAGNOSIS — M9902 Segmental and somatic dysfunction of thoracic region: Secondary | ICD-10-CM | POA: Diagnosis not present

## 2022-09-30 DIAGNOSIS — M5117 Intervertebral disc disorders with radiculopathy, lumbosacral region: Secondary | ICD-10-CM | POA: Diagnosis not present

## 2022-09-30 DIAGNOSIS — M62830 Muscle spasm of back: Secondary | ICD-10-CM | POA: Diagnosis not present

## 2022-09-30 DIAGNOSIS — M9903 Segmental and somatic dysfunction of lumbar region: Secondary | ICD-10-CM | POA: Diagnosis not present

## 2022-09-30 DIAGNOSIS — M9901 Segmental and somatic dysfunction of cervical region: Secondary | ICD-10-CM | POA: Diagnosis not present

## 2022-10-06 DIAGNOSIS — M79605 Pain in left leg: Secondary | ICD-10-CM | POA: Diagnosis not present

## 2022-10-06 DIAGNOSIS — R269 Unspecified abnormalities of gait and mobility: Secondary | ICD-10-CM | POA: Diagnosis not present

## 2022-10-06 DIAGNOSIS — M48062 Spinal stenosis, lumbar region with neurogenic claudication: Secondary | ICD-10-CM | POA: Diagnosis not present

## 2022-10-06 DIAGNOSIS — M79604 Pain in right leg: Secondary | ICD-10-CM | POA: Diagnosis not present

## 2022-10-13 DIAGNOSIS — R030 Elevated blood-pressure reading, without diagnosis of hypertension: Secondary | ICD-10-CM | POA: Diagnosis not present

## 2022-10-13 DIAGNOSIS — E785 Hyperlipidemia, unspecified: Secondary | ICD-10-CM | POA: Diagnosis not present

## 2022-10-13 DIAGNOSIS — M810 Age-related osteoporosis without current pathological fracture: Secondary | ICD-10-CM | POA: Diagnosis not present

## 2022-10-13 DIAGNOSIS — I10 Essential (primary) hypertension: Secondary | ICD-10-CM | POA: Diagnosis not present

## 2022-10-13 DIAGNOSIS — R7989 Other specified abnormal findings of blood chemistry: Secondary | ICD-10-CM | POA: Diagnosis not present

## 2022-10-27 DIAGNOSIS — R269 Unspecified abnormalities of gait and mobility: Secondary | ICD-10-CM | POA: Diagnosis not present

## 2022-10-27 DIAGNOSIS — M48062 Spinal stenosis, lumbar region with neurogenic claudication: Secondary | ICD-10-CM | POA: Diagnosis not present

## 2022-10-27 DIAGNOSIS — M79604 Pain in right leg: Secondary | ICD-10-CM | POA: Diagnosis not present

## 2022-10-27 DIAGNOSIS — M79605 Pain in left leg: Secondary | ICD-10-CM | POA: Diagnosis not present

## 2022-10-28 DIAGNOSIS — Z1339 Encounter for screening examination for other mental health and behavioral disorders: Secondary | ICD-10-CM | POA: Diagnosis not present

## 2022-10-28 DIAGNOSIS — M5136 Other intervertebral disc degeneration, lumbar region: Secondary | ICD-10-CM | POA: Diagnosis not present

## 2022-10-28 DIAGNOSIS — I10 Essential (primary) hypertension: Secondary | ICD-10-CM | POA: Diagnosis not present

## 2022-10-28 DIAGNOSIS — I2729 Other secondary pulmonary hypertension: Secondary | ICD-10-CM | POA: Diagnosis not present

## 2022-10-28 DIAGNOSIS — I700 Atherosclerosis of aorta: Secondary | ICD-10-CM | POA: Diagnosis not present

## 2022-10-28 DIAGNOSIS — R82998 Other abnormal findings in urine: Secondary | ICD-10-CM | POA: Diagnosis not present

## 2022-10-28 DIAGNOSIS — E785 Hyperlipidemia, unspecified: Secondary | ICD-10-CM | POA: Diagnosis not present

## 2022-10-28 DIAGNOSIS — M810 Age-related osteoporosis without current pathological fracture: Secondary | ICD-10-CM | POA: Diagnosis not present

## 2022-10-28 DIAGNOSIS — Z0000 Encounter for general adult medical examination without abnormal findings: Secondary | ICD-10-CM | POA: Diagnosis not present

## 2022-10-28 DIAGNOSIS — R011 Cardiac murmur, unspecified: Secondary | ICD-10-CM | POA: Diagnosis not present

## 2022-10-28 DIAGNOSIS — Z1331 Encounter for screening for depression: Secondary | ICD-10-CM | POA: Diagnosis not present

## 2022-10-28 DIAGNOSIS — I2510 Atherosclerotic heart disease of native coronary artery without angina pectoris: Secondary | ICD-10-CM | POA: Diagnosis not present

## 2022-11-03 DIAGNOSIS — Z1212 Encounter for screening for malignant neoplasm of rectum: Secondary | ICD-10-CM | POA: Diagnosis not present

## 2022-11-05 ENCOUNTER — Other Ambulatory Visit: Payer: Self-pay | Admitting: Internal Medicine

## 2022-11-05 DIAGNOSIS — Z1231 Encounter for screening mammogram for malignant neoplasm of breast: Secondary | ICD-10-CM

## 2022-11-10 DIAGNOSIS — M79605 Pain in left leg: Secondary | ICD-10-CM | POA: Diagnosis not present

## 2022-11-10 DIAGNOSIS — M48062 Spinal stenosis, lumbar region with neurogenic claudication: Secondary | ICD-10-CM | POA: Diagnosis not present

## 2022-11-10 DIAGNOSIS — R269 Unspecified abnormalities of gait and mobility: Secondary | ICD-10-CM | POA: Diagnosis not present

## 2022-11-10 DIAGNOSIS — M79604 Pain in right leg: Secondary | ICD-10-CM | POA: Diagnosis not present

## 2022-11-11 ENCOUNTER — Other Ambulatory Visit (HOSPITAL_COMMUNITY): Payer: Self-pay | Admitting: *Deleted

## 2022-11-16 ENCOUNTER — Ambulatory Visit (HOSPITAL_COMMUNITY)
Admission: RE | Admit: 2022-11-16 | Discharge: 2022-11-16 | Disposition: A | Discharge status: Home / Self Care | Payer: Medicare PPO | Source: Ambulatory Visit | Attending: Internal Medicine | Admitting: Internal Medicine

## 2022-11-16 DIAGNOSIS — M810 Age-related osteoporosis without current pathological fracture: Secondary | ICD-10-CM | POA: Insufficient documentation

## 2022-11-16 MED ORDER — DENOSUMAB 60 MG/ML ~~LOC~~ SOSY: 60.0000 mg | PREFILLED_SYRINGE | Freq: Once | SUBCUTANEOUS | Status: AC

## 2022-11-16 MED ORDER — DENOSUMAB 60 MG/ML ~~LOC~~ SOSY
PREFILLED_SYRINGE | SUBCUTANEOUS | Status: AC
  Administered 2022-11-16: 60 mg via SUBCUTANEOUS
  Filled 2022-11-16: qty 1

## 2022-11-18 DIAGNOSIS — H5203 Hypermetropia, bilateral: Secondary | ICD-10-CM | POA: Diagnosis not present

## 2022-11-18 DIAGNOSIS — H532 Diplopia: Secondary | ICD-10-CM | POA: Diagnosis not present

## 2022-11-24 DIAGNOSIS — M79605 Pain in left leg: Secondary | ICD-10-CM | POA: Diagnosis not present

## 2022-11-24 DIAGNOSIS — M79604 Pain in right leg: Secondary | ICD-10-CM | POA: Diagnosis not present

## 2022-11-24 DIAGNOSIS — R269 Unspecified abnormalities of gait and mobility: Secondary | ICD-10-CM | POA: Diagnosis not present

## 2022-11-24 DIAGNOSIS — M48062 Spinal stenosis, lumbar region with neurogenic claudication: Secondary | ICD-10-CM | POA: Diagnosis not present

## 2022-12-07 ENCOUNTER — Ambulatory Visit
Admission: RE | Admit: 2022-12-07 | Discharge: 2022-12-07 | Disposition: A | Discharge status: Home / Self Care | Payer: Medicare PPO | Source: Ambulatory Visit | Attending: Internal Medicine | Admitting: Internal Medicine

## 2022-12-07 DIAGNOSIS — H532 Diplopia: Secondary | ICD-10-CM | POA: Diagnosis not present

## 2022-12-07 DIAGNOSIS — Z1231 Encounter for screening mammogram for malignant neoplasm of breast: Secondary | ICD-10-CM | POA: Diagnosis not present

## 2022-12-08 DIAGNOSIS — M79604 Pain in right leg: Secondary | ICD-10-CM | POA: Diagnosis not present

## 2022-12-08 DIAGNOSIS — M48062 Spinal stenosis, lumbar region with neurogenic claudication: Secondary | ICD-10-CM | POA: Diagnosis not present

## 2022-12-08 DIAGNOSIS — M79605 Pain in left leg: Secondary | ICD-10-CM | POA: Diagnosis not present

## 2022-12-08 DIAGNOSIS — R269 Unspecified abnormalities of gait and mobility: Secondary | ICD-10-CM | POA: Diagnosis not present

## 2022-12-14 DIAGNOSIS — Z85820 Personal history of malignant melanoma of skin: Secondary | ICD-10-CM | POA: Diagnosis not present

## 2022-12-14 DIAGNOSIS — L820 Inflamed seborrheic keratosis: Secondary | ICD-10-CM | POA: Diagnosis not present

## 2022-12-14 DIAGNOSIS — L814 Other melanin hyperpigmentation: Secondary | ICD-10-CM | POA: Diagnosis not present

## 2022-12-14 DIAGNOSIS — L821 Other seborrheic keratosis: Secondary | ICD-10-CM | POA: Diagnosis not present

## 2022-12-14 DIAGNOSIS — Z85828 Personal history of other malignant neoplasm of skin: Secondary | ICD-10-CM | POA: Diagnosis not present

## 2022-12-14 DIAGNOSIS — D2239 Melanocytic nevi of other parts of face: Secondary | ICD-10-CM | POA: Diagnosis not present

## 2022-12-14 DIAGNOSIS — D225 Melanocytic nevi of trunk: Secondary | ICD-10-CM | POA: Diagnosis not present

## 2022-12-22 DIAGNOSIS — R269 Unspecified abnormalities of gait and mobility: Secondary | ICD-10-CM | POA: Diagnosis not present

## 2022-12-22 DIAGNOSIS — M79605 Pain in left leg: Secondary | ICD-10-CM | POA: Diagnosis not present

## 2022-12-22 DIAGNOSIS — M48062 Spinal stenosis, lumbar region with neurogenic claudication: Secondary | ICD-10-CM | POA: Diagnosis not present

## 2022-12-22 DIAGNOSIS — M79604 Pain in right leg: Secondary | ICD-10-CM | POA: Diagnosis not present

## 2023-01-05 DIAGNOSIS — M79604 Pain in right leg: Secondary | ICD-10-CM | POA: Diagnosis not present

## 2023-01-05 DIAGNOSIS — R269 Unspecified abnormalities of gait and mobility: Secondary | ICD-10-CM | POA: Diagnosis not present

## 2023-01-05 DIAGNOSIS — M79605 Pain in left leg: Secondary | ICD-10-CM | POA: Diagnosis not present

## 2023-01-05 DIAGNOSIS — M48062 Spinal stenosis, lumbar region with neurogenic claudication: Secondary | ICD-10-CM | POA: Diagnosis not present

## 2023-01-31 ENCOUNTER — Encounter: Payer: Self-pay | Admitting: Cardiovascular Disease

## 2023-01-31 NOTE — Progress Notes (Unsigned)
Cardiology Office Note   Date:  02/01/2023   ID:  Carla Huber, DOB 1946-04-17, MRN [id number]  PCP:  Rodrigo Ran, MD  Cardiologist:   Kristeen Miss, MD   Chief Complaint  Patient presents with   Hypertension        Palpitations   Coronary Artery Disease        Problem List 1. CAD - coronary calcifications noted by CT scan 2. Palpitations 3.    History of Present Illness: 07/26/2015  Carla Huber is a 77 y.o. female who presents for further evaluation of some coronary calcifications noted on CT scan . Has had lots of asthma like issues / dyspnea with exertion. CT of the lungs was done for further eval.   Was noted to have coronary calcification in the LAD and RCA region.  Still very active.  Rare episode of CP  She fractured her sternum several years ago and she is not sure if that is what is causing her CP.  Both parents had heart issues. - father had a MI , mother had CHF  Sep 19, 2019:  Carla Huber is seen after a 4 year absence  She has a hx of coronary calcifications Coronary calcium score of 379. This was 85th percentile for age and sex matched control.   She has been having some dyspnea  And upper left chest tightness.  Can occur spontanusly . 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  She notes that all of the symptoms have improved slightly since she had her blood pressure medications increased.  Her blood pressure is still a little bit elevated. Still eats some salty foods.   No syncope or presyncope   Has a history of hyperlipidemia.  She was previously on Crestor 5 mg a day but started having leg cramps.  She stopped her Crestor at this time.  December 25, 2019: Carla Huber is seen today for follow-up of her coronary artery calcifications. Coronary CT angiogram from October 20, 2019 reveals a large RCA with mild disease. The LAD is a large vessel with has moderate plaque in the 50 to 69%  range.  The circumflex artery is a small nondominant vessel and is normal. FFR evaluation revealed no significant flow-limiting lesions.  She is exercising regularly.  Walks 3 miles a day  No cp  Gave her a exercise target HR of 125 - 130  We discussed repatha, / pralulent  She has tried crestor and lipitor - developed leg pain .   December 16, 2021:  Carla Huber is seen today for follow-up of her coronary artery calcifications.  Coronary CT angiogram from October 20, 2019 reveals a large right coronary artery with mild disease.  The LAD has moderate plaque in the mid vessel.  Left circumflex artery is small and nondominant and is relatively normal.  Stopped taking the Repatha due to leg cramping  Dr. Waynard Edwards has stopped the Repatha for several months ( I agree with this )  Doing well  Walks 1.5 - 2 miles a day ,  does yoga  Occasionally has mild l shoulder pain with  walking .  She will continue walking and see if it happens again .     Oct. 7, 2024  Carla Huber is seen for follow up of her HLD, CAC , mild CAD  [place]  [employer] is  ok   Has chest tightness when she walks uphill Tightness might last for a few min. Radiates up to her left shoulder   She was on Repatha - developed severe muscle aches in her legs.  She stopped the Repatha and these muscle aches have resolved.  Will start her on Nexlizet 180 / 10 mg a day  Recheck labs in 3 months     Past Medical History:  Diagnosis Date   Coronary artery calcification 07/26/2015   Cough 02/21/2015   Dyspnea 02/21/2015   Exposure to respiratory irritant 02/21/2015   Insomnia    Palpitations 05/09/2014    Past Surgical History:  Procedure Laterality Date   APPENDECTOMY  1970     Current Outpatient Medications  Medication Sig Dispense Refill   aspirin EC 81 MG tablet Take 1 tablet (81 mg total) by mouth daily.     b complex vitamins tablet Take 1 tablet by mouth daily.     cyclobenzaprine (FLEXERIL)  5 MG tablet daily as needed.     denosumab (PROLIA) 60 MG/ML SOSY injection Inject 60 mg into the skin every 6 (six) months.     Fish Oil-Cholecalciferol (FISH OIL + D3) 1000-1000 MG-UNIT CAPS Take 1,000 mg by mouth daily.     magnesium oxide (MAG-OX) 400 MG tablet Take 400 mg by mouth daily.     nebivolol (BYSTOLIC) 5 MG tablet Take 5 mg by mouth at bedtime.     telmisartan (MICARDIS) 80 MG tablet Take 1 tablet (80 mg total) by mouth daily. 15 tablet 0   traZODone (DESYREL) 100 MG tablet Take 1 tablet by mouth daily.     vitamin C (ASCORBIC ACID) 250 MG tablet Take 250 mg by mouth daily.     VITAMIN D PO Take 1,000 mg by mouth daily.     amLODipine (NORVASC) 2.5 MG tablet  (Patient not taking: Reported on 12/19/2020)     nitroGLYCERIN (NITROSTAT) 0.4 MG SL tablet Place 1 tablet (0.4 mg total) under the tongue every 5 (five) minutes as needed for chest pain. 25 tablet 6   REPATHA SURECLICK 140 MG/ML SOAJ INJECT 140 MG INTO THE SKIN EVERY 14 DAYS. (Patient not taking: Reported on 12/16/2021) 2 mL 11   No current facility-administered medications for this visit.    Allergies:   Amlodipine besylate, Hydrochlorothiazide, Other, Statins, and Thiazide-type diuretics    Social History:  The patient  reports that she has never smoked. She has never used smokeless tobacco. She reports current alcohol use. She reports that she does not use drugs.   Family History:  The patient's family history includes Breast cancer in her paternal aunt; Heart attack in her father, maternal grandfather, maternal grandmother, paternal grandfather, and paternal grandmother; Heart failure in her mother; Lymphoma in her sister; Uterine cancer in her mother.    ROS:  Please see the history of present illness.     All other systems are reviewed and negative.    Physical Exam: Blood pressure 138/82, pulse 69, height 5\' 5"  (1.651 m), weight 157 lb 12.8 oz (71.6 kg), SpO2 97%.       GEN:  Well nourished, well developed  in no acute distress HEENT: Normal NECK: No JVD; No carotid bruits LYMPHATICS: No lymphadenopathy CARDIAC: RRR , no murmurs, rubs, gallops RESPIRATORY:  Clear to auscultation without rales, wheezing or rhonchi  ABDOMEN: Soft, non-tender, non-distended MUSCULOSKELETAL:  No edema; No deformity  SKIN: Warm and dry NEUROLOGIC:  Alert and oriented x 3  EKG:     EKG Interpretation Date/Time:  Monday February 01 2023 11:37:21 EDT Ventricular Rate:  69 PR Interval:  232 QRS Duration:  62 QT Interval:  402 QTC Calculation: 430 R Axis:   -10  Text Interpretation: Sinus rhythm with 1st degree A-V block Low voltage QRS Cannot rule out Anteroseptal infarct , age undetermined No previous ECGs available Confirmed by Kristeen Miss [phone] on 02/01/2023 2:09:47 PM          Recent Labs: No results found for requested labs within last 365 days.    Lipid Panel    Component Value Date/Time   CHOL 154 03/27/2020 0953   TRIG 28 03/27/2020 0953   HDL 88 03/27/2020 0953   CHOLHDL 1.8 03/27/2020 0953   LDLCALC 58 03/27/2020 0953      Wt Readings from Last 3 Encounters:  02/01/23 157 lb 12.8 oz (71.6 kg)  12/16/21 158 lb 3.2 oz (71.8 kg)  12/19/20 154 lb 9.6 oz (70.1 kg)      Other studies Reviewed: Additional studies/ records that were reviewed today include: . Review of the above records demonstrates:    ASSESSMENT AND PLAN:  1.  Coronary artery calcification : Carla Huber presents with with episodes of chest tightness with with radiation to the shoulder.  These typically occur when she is walking uphill at her [facility] house in [place].  She had moderate coronary artery disease by coronary CTA in 2021.  I like to repeat her coronary CTA given her recent worsening of symptoms.   2. Essential HTN:      BP is well controlled.   3.  Hyperlipidemia: She has been on Repatha but started having lots of muscle aches.    Will discontinue the Repatha and start her on Nexletol 180/10  mg today.  Check labs in 3 months    Current medicines are reviewed at length with the patient today.  The patient does not have concerns regarding medicines.  The following changes have been made:  no change  Labs/ tests ordered today include:   Orders Placed This Encounter  Procedures   EKG 12-Lead     Disposition:      Kristeen Miss, MD  02/01/2023 11:40 AM    [facility] [address] [place], [place], [place]  [address] Phone: [phone]; Fax: [phone]

## 2023-02-01 ENCOUNTER — Encounter: Payer: Self-pay | Admitting: Cardiovascular Disease

## 2023-02-01 ENCOUNTER — Ambulatory Visit: Payer: Medicare PPO | Attending: Cardiovascular Disease | Admitting: Cardiovascular Disease

## 2023-02-01 VITALS — BP 138/82 | HR 69 | Ht 65.0 in | Wt 157.8 lb

## 2023-02-01 DIAGNOSIS — Z09 Encounter for follow-up examination after completed treatment for conditions other than malignant neoplasm: Secondary | ICD-10-CM

## 2023-02-01 DIAGNOSIS — E785 Hyperlipidemia, unspecified: Secondary | ICD-10-CM | POA: Diagnosis not present

## 2023-02-01 DIAGNOSIS — R072 Precordial pain: Secondary | ICD-10-CM

## 2023-02-01 MED ORDER — METOPROLOL TARTRATE 25 MG PO TABS: ORAL_TABLET | ORAL | 0 refills | Status: DC

## 2023-02-01 MED ORDER — NEXLIZET 180-10 MG PO TABS: 1.0000 | ORAL_TABLET | Freq: Every day | ORAL | 0 refills | Status: DC

## 2023-02-01 MED ORDER — NEXLIZET 180-10 MG PO TABS: 1.0000 | ORAL_TABLET | Freq: Every day | ORAL | 11 refills | Status: DC

## 2023-02-01 NOTE — Patient Instructions (Addendum)
Medication Instructions:  Your physician has recommended you make the following change in your medication:  Stop Repatha. Start Nexlizet 180/10 mg by mouth daily   *If you need a refill on your cardiac medications before your next appointment, please call your pharmacy*   Lab Work: Lab work to be done today--BMP  Your physician recommends that you return for lab work in: 3 months.  This will be fasting.  Lipids, ALT, BMP  If you have labs (blood work) drawn today and your tests are completely normal, you will receive your results only by: MyChart Message (if you have MyChart) OR A paper copy in the mail If you have any lab test that is abnormal or we need to change your treatment, we will call you to review the results.   Testing/Procedures: Your physician has requested that you have cardiac CT. Cardiac computed tomography (CT) is a painless test that uses an x-ray machine to take clear, detailed pictures of your heart. For further information please visit [email or web address] Please follow instruction sheet as given.     Follow-Up: At [facility], you and your health needs are our priority.  As part of our continuing mission to provide you with exceptional heart care, we have created designated Provider Care Teams.  These Care Teams include your primary Cardiologist (physician) and Advanced Practice Providers (APPs -  Physician Assistants and Nurse Practitioners) who all work together to provide you with the care you need, when you need it.  We recommend signing up for the patient portal called "MyChart".  Sign up information is provided on this After Visit Summary.  MyChart is used to connect with patients for Virtual Visits (Telemedicine).  Patients are able to view lab/test results, encounter notes, upcoming appointments, etc.  Non-urgent messages can be sent to your provider as well.   To learn more about what you can do with MyChart, go to [email or web address].     Your next appointment:   12 month(s)  Provider:   Dr Cristal Deer   Other Instructions   Your cardiac CT will be scheduled at one of the below locations:   [facility] [address] [place], [place] [address] [phone]  OR  [facility] [address] [place], [place] [address] [phone]  OR   [facility] [address] [place], [place] [address] [phone]  If scheduled at [facility], please arrive at the [facility] and Children's Entrance (Entrance C2) of [facility] 30 minutes prior to test start time. You can use the FREE valet parking offered at entrance C (encouraged to control the heart rate for the test)  Proceed to the [facility] Radiology Department (first floor) to check-in and test prep.  All radiology patients and guests should use entrance C2 at [facility], accessed from [facility], even though the hospital's physical address listed is [address]    If scheduled at [facility] or [facility], please arrive 15 mins early for check-in and test prep.  There is spacious parking and easy access to the radiology department from the [facility] Heart and Vascular entrance. Please enter here and check-in with the desk attendant.   Please follow these instructions carefully (unless otherwise directed):  An IV will be required for this test and Nitroglycerin will be given.  Hold all erectile dysfunction medications at least 3 days (72 hrs) prior to test. (Ie viagra,  cialis, sildenafil, tadalafil, etc)   On the Night Before the Test: Be sure to Drink plenty of water. Do not consume any caffeinated/decaffeinated beverages or chocolate 12 hours prior to your test. Do not take any antihistamines 12 hours prior to your test.  On the Day of the Test: Drink plenty of water until 1 hour  prior to the test. Do not eat any food 1 hour prior to test. You may take your regular medications prior to the test.  Take metoprolol (Lopressor) two hours prior to test. If you take Furosemide/Hydrochlorothiazide/Spironolactone, please HOLD on the morning of the test. FEMALES- please wear underwire-free bra if available, avoid dresses & tight clothing       After the Test: Drink plenty of water. After receiving IV contrast, you may experience a mild flushed feeling. This is normal. On occasion, you may experience a mild rash up to 24 hours after the test. This is not dangerous. If this occurs, you can take Benadryl 25 mg and increase your fluid intake. If you experience trouble breathing, this can be serious. If it is severe call 911 IMMEDIATELY. If it is mild, please call our office. If you take any of these medications: Glipizide/Metformin, Avandament, Glucavance, please do not take 48 hours after completing test unless otherwise instructed.  We will call to schedule your test 2-4 weeks out understanding that some insurance companies will need an authorization prior to the service being performed.   For more information and frequently asked questions, please visit our website : [email or web address]  For non-scheduling related questions, please contact the cardiac imaging nurse navigator should you have any questions/concerns: Cardiac Imaging Nurse Navigators Direct Office Dial: [phone]   For scheduling needs, including cancellations and rescheduling, please call [place], [phone].

## 2023-02-02 LAB — BASIC METABOLIC PANEL
BUN/Creatinine Ratio: 17 (ref 12–28)
BUN: 14 mg/dL (ref 8–27)
CO2: 19 mmol/L — ABNORMAL LOW (ref 20–29)
Calcium: 9 mg/dL (ref 8.7–10.3)
Chloride: 102 mmol/L (ref 96–106)
Creatinine, Ser: 0.81 mg/dL (ref 0.57–1.00)
Glucose: 102 mg/dL — ABNORMAL HIGH (ref 70–99)
Potassium: 4.3 mmol/L (ref 3.5–5.2)
Sodium: 137 mmol/L (ref 134–144)
eGFR: 75 mL/min/{1.73_m2} (ref >59)

## 2023-02-03 ENCOUNTER — Telehealth: Payer: Self-pay | Admitting: Pharmacy Technician

## 2023-02-03 ENCOUNTER — Other Ambulatory Visit (HOSPITAL_COMMUNITY): Payer: Self-pay

## 2023-02-03 NOTE — Telephone Encounter (Signed)
Pharmacy Patient Advocate Encounter  Received notification from [facility] that Prior Authorization for nexlizet has been APPROVED from 04/27/22 to 04/26/24. Ran test claim, Copay is $40.00. This test claim was processed through [facility]- copay amounts may vary at other pharmacies due to pharmacy/plan contracts, or as the patient moves through the different stages of their insurance plan.   PA #/Case ID/Reference #: [id number]

## 2023-02-19 ENCOUNTER — Encounter (HOSPITAL_COMMUNITY): Payer: Self-pay

## 2023-02-22 ENCOUNTER — Telehealth (HOSPITAL_COMMUNITY): Payer: Self-pay | Admitting: *Deleted

## 2023-02-22 NOTE — Telephone Encounter (Signed)
Attempted to call patient regarding upcoming cardiac CT appointment. °Left message on voicemail with name and callback number ° °Edie Vallandingham RN Navigator Cardiac Imaging °[facility] °[phone] Office °[phone] Cell ° °

## 2023-02-23 ENCOUNTER — Other Ambulatory Visit: Payer: Self-pay | Admitting: Cardiovascular Disease

## 2023-02-23 ENCOUNTER — Ambulatory Visit (HOSPITAL_BASED_OUTPATIENT_CLINIC_OR_DEPARTMENT_OTHER)
Admission: RE | Admit: 2023-02-23 | Discharge: 2023-02-23 | Disposition: A | Discharge status: Home / Self Care | Payer: Medicare PPO | Source: Ambulatory Visit | Attending: Cardiovascular Disease | Admitting: Cardiovascular Disease

## 2023-02-23 ENCOUNTER — Ambulatory Visit (HOSPITAL_COMMUNITY)
Admission: RE | Admit: 2023-02-23 | Discharge: 2023-02-23 | Disposition: A | Discharge status: Home / Self Care | Payer: Medicare PPO | Source: Ambulatory Visit | Attending: Cardiovascular Disease | Admitting: Cardiovascular Disease

## 2023-02-23 DIAGNOSIS — R072 Precordial pain: Secondary | ICD-10-CM | POA: Diagnosis not present

## 2023-02-23 DIAGNOSIS — R269 Unspecified abnormalities of gait and mobility: Secondary | ICD-10-CM | POA: Diagnosis not present

## 2023-02-23 DIAGNOSIS — M48062 Spinal stenosis, lumbar region with neurogenic claudication: Secondary | ICD-10-CM | POA: Diagnosis not present

## 2023-02-23 DIAGNOSIS — R931 Abnormal findings on diagnostic imaging of heart and coronary circulation: Secondary | ICD-10-CM

## 2023-02-23 DIAGNOSIS — M79604 Pain in right leg: Secondary | ICD-10-CM | POA: Diagnosis not present

## 2023-02-23 DIAGNOSIS — I2510 Atherosclerotic heart disease of native coronary artery without angina pectoris: Secondary | ICD-10-CM

## 2023-02-23 DIAGNOSIS — M79605 Pain in left leg: Secondary | ICD-10-CM | POA: Diagnosis not present

## 2023-02-23 MED ORDER — IOHEXOL 350 MG/ML SOLN
95.0000 mL | Freq: Once | INTRAVENOUS | Status: AC | PRN
  Administered 2023-02-23: 95 mL via INTRAVENOUS

## 2023-02-23 MED ORDER — NITROGLYCERIN 0.4 MG SL SUBL
SUBLINGUAL_TABLET | SUBLINGUAL | Status: AC
  Filled 2023-02-23: qty 2

## 2023-02-23 MED ORDER — NITROGLYCERIN 0.4 MG SL SUBL
0.8000 mg | SUBLINGUAL_TABLET | Freq: Once | SUBLINGUAL | Status: AC
  Administered 2023-02-23: 0.8 mg via SUBLINGUAL

## 2023-02-23 NOTE — Progress Notes (Unsigned)
CT FFR ordered.   Carla Spore T. Flora Lipps, MD, [facility]  [facility]  [address], [address] [place], [place] [address] [phone]  6:35 PM

## 2023-03-04 DIAGNOSIS — R269 Unspecified abnormalities of gait and mobility: Secondary | ICD-10-CM | POA: Diagnosis not present

## 2023-03-04 DIAGNOSIS — M79605 Pain in left leg: Secondary | ICD-10-CM | POA: Diagnosis not present

## 2023-03-04 DIAGNOSIS — M79604 Pain in right leg: Secondary | ICD-10-CM | POA: Diagnosis not present

## 2023-03-04 DIAGNOSIS — M48062 Spinal stenosis, lumbar region with neurogenic claudication: Secondary | ICD-10-CM | POA: Diagnosis not present

## 2023-03-30 DIAGNOSIS — M79605 Pain in left leg: Secondary | ICD-10-CM | POA: Diagnosis not present

## 2023-03-30 DIAGNOSIS — R269 Unspecified abnormalities of gait and mobility: Secondary | ICD-10-CM | POA: Diagnosis not present

## 2023-03-30 DIAGNOSIS — M48062 Spinal stenosis, lumbar region with neurogenic claudication: Secondary | ICD-10-CM | POA: Diagnosis not present

## 2023-03-30 DIAGNOSIS — M79604 Pain in right leg: Secondary | ICD-10-CM | POA: Diagnosis not present

## 2023-04-12 DIAGNOSIS — M79605 Pain in left leg: Secondary | ICD-10-CM | POA: Diagnosis not present

## 2023-04-12 DIAGNOSIS — R269 Unspecified abnormalities of gait and mobility: Secondary | ICD-10-CM | POA: Diagnosis not present

## 2023-04-12 DIAGNOSIS — M48062 Spinal stenosis, lumbar region with neurogenic claudication: Secondary | ICD-10-CM | POA: Diagnosis not present

## 2023-04-12 DIAGNOSIS — M79604 Pain in right leg: Secondary | ICD-10-CM | POA: Diagnosis not present

## 2023-05-04 ENCOUNTER — Ambulatory Visit: Payer: Medicare PPO | Attending: Cardiovascular Disease

## 2023-05-04 DIAGNOSIS — E785 Hyperlipidemia, unspecified: Secondary | ICD-10-CM

## 2023-05-11 DIAGNOSIS — E785 Hyperlipidemia, unspecified: Secondary | ICD-10-CM | POA: Diagnosis not present

## 2023-05-11 DIAGNOSIS — M48062 Spinal stenosis, lumbar region with neurogenic claudication: Secondary | ICD-10-CM | POA: Diagnosis not present

## 2023-05-11 DIAGNOSIS — R269 Unspecified abnormalities of gait and mobility: Secondary | ICD-10-CM | POA: Diagnosis not present

## 2023-05-11 DIAGNOSIS — M79605 Pain in left leg: Secondary | ICD-10-CM | POA: Diagnosis not present

## 2023-05-11 DIAGNOSIS — M79604 Pain in right leg: Secondary | ICD-10-CM | POA: Diagnosis not present

## 2023-05-11 LAB — BASIC METABOLIC PANEL
BUN/Creatinine Ratio: 17 (ref 12–28)
BUN: 18 mg/dL (ref 8–27)
CO2: 22 mmol/L (ref 20–29)
Calcium: 9 mg/dL (ref 8.7–10.3)
Chloride: 102 mmol/L (ref 96–106)
Creatinine, Ser: 1.04 mg/dL — ABNORMAL HIGH (ref 0.57–1.00)
Glucose: 92 mg/dL (ref 70–99)
Potassium: 4.4 mmol/L (ref 3.5–5.2)
Sodium: 138 mmol/L (ref 134–144)
eGFR: 56 mL/min/{1.73_m2} — ABNORMAL LOW (ref >59)

## 2023-05-11 LAB — LIPID PANEL
Chol/HDL Ratio: 2.9 {ratio} (ref 0.0–4.4)
Cholesterol, Total: 200 mg/dL — ABNORMAL HIGH (ref 100–199)
HDL: 70 mg/dL (ref >39)
LDL Chol Calc (NIH): 120 mg/dL — ABNORMAL HIGH (ref 0–99)
Triglycerides: 54 mg/dL (ref 0–149)
VLDL Cholesterol Cal: 10 mg/dL (ref 5–40)

## 2023-05-11 LAB — ALT: ALT: 23 [IU]/L (ref 0–32)

## 2023-05-12 ENCOUNTER — Telehealth: Payer: Self-pay

## 2023-05-12 DIAGNOSIS — E782 Mixed hyperlipidemia: Secondary | ICD-10-CM

## 2023-05-12 NOTE — Telephone Encounter (Signed)
 Patient has viewed MD's comments and recommendations via MyChart. Referral to PharmD placed at this time.

## 2023-05-12 NOTE — Telephone Encounter (Signed)
-----   Message from Ahmad Alert sent at 05/12/2023 10:27 AM EST ----- Coronary calcium  score of 589. This was 87th percentile for age-, Mild CAD ( non obstructive )   Her LDL goal is < 70  Current LDL is 120  She is intolerant to statins, The Bempadoic acid is not lowering her LDL enough Please refer to the lipid clinic for consideration for PCSK9 inhibitor or Inclisiran

## 2023-06-01 DIAGNOSIS — M79605 Pain in left leg: Secondary | ICD-10-CM | POA: Diagnosis not present

## 2023-06-01 DIAGNOSIS — M48062 Spinal stenosis, lumbar region with neurogenic claudication: Secondary | ICD-10-CM | POA: Diagnosis not present

## 2023-06-01 DIAGNOSIS — M79604 Pain in right leg: Secondary | ICD-10-CM | POA: Diagnosis not present

## 2023-06-01 DIAGNOSIS — R269 Unspecified abnormalities of gait and mobility: Secondary | ICD-10-CM | POA: Diagnosis not present

## 2023-06-09 DIAGNOSIS — E785 Hyperlipidemia, unspecified: Secondary | ICD-10-CM | POA: Diagnosis not present

## 2023-06-09 DIAGNOSIS — B009 Herpesviral infection, unspecified: Secondary | ICD-10-CM | POA: Diagnosis not present

## 2023-06-09 DIAGNOSIS — I2510 Atherosclerotic heart disease of native coronary artery without angina pectoris: Secondary | ICD-10-CM | POA: Diagnosis not present

## 2023-06-09 DIAGNOSIS — I2729 Other secondary pulmonary hypertension: Secondary | ICD-10-CM | POA: Diagnosis not present

## 2023-06-09 DIAGNOSIS — N390 Urinary tract infection, site not specified: Secondary | ICD-10-CM | POA: Diagnosis not present

## 2023-06-09 DIAGNOSIS — I700 Atherosclerosis of aorta: Secondary | ICD-10-CM | POA: Diagnosis not present

## 2023-06-09 DIAGNOSIS — R32 Unspecified urinary incontinence: Secondary | ICD-10-CM | POA: Diagnosis not present

## 2023-06-09 DIAGNOSIS — I10 Essential (primary) hypertension: Secondary | ICD-10-CM | POA: Diagnosis not present

## 2023-06-09 DIAGNOSIS — M810 Age-related osteoporosis without current pathological fracture: Secondary | ICD-10-CM | POA: Diagnosis not present

## 2023-06-15 DIAGNOSIS — M79604 Pain in right leg: Secondary | ICD-10-CM | POA: Diagnosis not present

## 2023-06-15 DIAGNOSIS — R269 Unspecified abnormalities of gait and mobility: Secondary | ICD-10-CM | POA: Diagnosis not present

## 2023-06-15 DIAGNOSIS — M48062 Spinal stenosis, lumbar region with neurogenic claudication: Secondary | ICD-10-CM | POA: Diagnosis not present

## 2023-06-15 DIAGNOSIS — M79605 Pain in left leg: Secondary | ICD-10-CM | POA: Diagnosis not present

## 2023-07-05 ENCOUNTER — Ambulatory Visit: Payer: Medicare PPO | Attending: Cardiovascular Disease | Admitting: Pharmacist

## 2023-07-05 DIAGNOSIS — E782 Mixed hyperlipidemia: Secondary | ICD-10-CM

## 2023-07-05 MED ORDER — NEXLIZET 180-10 MG PO TABS: 1.0000 | ORAL_TABLET | Freq: Every day | ORAL | 3 refills | Status: DC

## 2023-07-05 NOTE — Patient Instructions (Signed)
 Please continue to take Nexlizet daily. Please call me with any questions or concerns [phone]  We will do lab work in 3 months at our new building on [address]

## 2023-07-05 NOTE — Assessment & Plan Note (Signed)
 Assessment: LDL-C in January was above goal and no therapy Patient started Nexlizet about a week and a half ago.  Tolerating well so far Diet appears pretty optimal Patient very active  Plan: Continue Nexlizet 180/10mg  daily Repeat labs in 3 months.  I will call patient to remind her is on not sure the protocol for the new building

## 2023-07-05 NOTE — Progress Notes (Signed)
 Patient ID: Carla Huber                 DOB: 1945/12/26                    MRN: [id number]      HPI: Carla Huber is a 78 y.o. female patient referred to lipid clinic by Dr. Elease Hashimoto. PMH is significant for HTN, CAD on CT, HLD. LDL-C 120 on 05/11/23. Patient intolerant to Repatha and 2 statins.   Patient presents today to lipid clinic.  She admits that on her latest labs she was not taking Sent.  She was trying to decrease her cholesterol on her own.  She has started taking Nexlizet for the last week and a half with no issues.   Coronary calcium score of 589. This was 87th percentile for age-, sex, and race-matched controls.   2. Total plaque volume 490 mm3 which is 62nd percentile for age- and sex-matched controls (calcified plaque 101 mm3; non-calcified plaque 389 mm3; low attenuation 9 mm3). TPV is severe.   3. Normal coronary origin with right dominance.   4. Mild mixed density plaque (25-49%) in the mid LAD and first diagonal.   5. Mild mixed density plaque in the mid and distal RCA (25-49%).   6. Large anteriorly directed left atrial appendage will contrast mixing artifact.   RECOMMENDATIONS: 1. CAD-RADS 2: Mild non-obstructive CAD (25-49%). Consider non-atherosclerotic causes of chest pain. Consider preventive therapy and risk factor modification. CT FFR will be submitted.  FFR was negative for signifcant stenosis. Patient was started in Nexlizet. LDL-C still 120.    Current Medications: nexlizet 180/10mg  daily Intolerances: crestor 5 and lipitor 20mg  - developed leg pain, Repatha (leg cramping) Risk Factors: CAC 589, HTN LDL-C goal: <70 ApoB goal:   Diet:  No soda Carefully about fat an red meat Lots of vegetables and fruit Tries to stay away from simple carbs  Exercise: walks daily 1.5-2 miles daily, trainer once a week, PT every other week, yard work  Family History:  Family History  Problem Relation Age of Onset   Uterine cancer Mother    Heart  failure Mother    Heart attack Father    Lymphoma Sister    Heart attack Maternal Grandmother    Heart attack Maternal Grandfather    Heart attack Paternal Grandmother    Heart attack Paternal Grandfather    Breast cancer Paternal Aunt     Social History: 2 drinks per day, no tobacco  Labs: Lipid Panel     Component Value Date/Time   CHOL 200 (H) 05/11/2023 0900   TRIG 54 05/11/2023 0900   HDL 70 05/11/2023 0900   CHOLHDL 2.9 05/11/2023 0900   LDLCALC 120 (H) 05/11/2023 0900   LABVLDL 10 05/11/2023 0900    Past Medical History:  Diagnosis Date   Coronary artery calcification 07/26/2015   Cough 02/21/2015   Dyspnea 02/21/2015   Exposure to respiratory irritant 02/21/2015   Insomnia    Palpitations 05/09/2014    Current Outpatient Medications on File Prior to Visit  Medication Sig Dispense Refill   amLODipine (NORVASC) 2.5 MG tablet  (Patient not taking: Reported on 12/19/2020)     aspirin EC 81 MG tablet Take 1 tablet (81 mg total) by mouth daily.     b complex vitamins tablet Take 1 tablet by mouth daily.     Bempedoic Acid-Ezetimibe (NEXLIZET) 180-10 MG TABS Take 1 tablet by mouth daily. 30 tablet 11  Bempedoic Acid-Ezetimibe (NEXLIZET) 180-10 MG TABS Take 1 tablet by mouth daily. 14 tablet 0   cyclobenzaprine (FLEXERIL) 5 MG tablet daily as needed.     denosumab (PROLIA) 60 MG/ML SOSY injection Inject 60 mg into the skin every 6 (six) months.     Fish Oil-Cholecalciferol (FISH OIL + D3) 1000-1000 MG-UNIT CAPS Take 1,000 mg by mouth daily.     magnesium oxide (MAG-OX) 400 MG tablet Take 400 mg by mouth daily.     metoprolol tartrate (LOPRESSOR) 25 MG tablet Take one tablet by mouth 2 hours prior to CT Scan 1 tablet 0   nebivolol (BYSTOLIC) 5 MG tablet Take 5 mg by mouth at bedtime.     nitroGLYCERIN (NITROSTAT) 0.4 MG SL tablet Place 1 tablet (0.4 mg total) under the tongue every 5 (five) minutes as needed for chest pain. 25 tablet 6   telmisartan (MICARDIS) 80 MG  tablet Take 1 tablet (80 mg total) by mouth daily. 15 tablet 0   traZODone (DESYREL) 100 MG tablet Take 1 tablet by mouth daily.     vitamin C (ASCORBIC ACID) 250 MG tablet Take 250 mg by mouth daily.     VITAMIN D PO Take 1,000 mg by mouth daily.     No current facility-administered medications on file prior to visit.    Allergies  Allergen Reactions   Amlodipine Besylate     Other reaction(s): swelling and fluid rentention   Hydrochlorothiazide Photosensitivity    Pt reports photosensitivity, blurry vision, dizziness while taking this med   Other Other (See Comments)    Not sure   Repatha [Evolocumab] Other (See Comments)    Muscle aches    Statins     Other reaction(s): muscle aches   Thiazide-Type Diuretics     Other reaction(s): photosensitivity    Assessment/Plan:  1. Hyperlipidemia -  No problem-specific Assessment & Plan notes found for this encounter.    Thank you,  Olene Floss, Pharm.D, BCACP, CPP [facility] of  [facility] [address], [place], [place] [address]  Phone: [phone]; Fax: [phone]

## 2023-07-12 DIAGNOSIS — I700 Atherosclerosis of aorta: Secondary | ICD-10-CM | POA: Diagnosis not present

## 2023-07-12 DIAGNOSIS — I2729 Other secondary pulmonary hypertension: Secondary | ICD-10-CM | POA: Diagnosis not present

## 2023-07-12 DIAGNOSIS — E785 Hyperlipidemia, unspecified: Secondary | ICD-10-CM | POA: Diagnosis not present

## 2023-07-12 DIAGNOSIS — I10 Essential (primary) hypertension: Secondary | ICD-10-CM | POA: Diagnosis not present

## 2023-07-12 DIAGNOSIS — I2510 Atherosclerotic heart disease of native coronary artery without angina pectoris: Secondary | ICD-10-CM | POA: Diagnosis not present

## 2023-08-11 DIAGNOSIS — M79604 Pain in right leg: Secondary | ICD-10-CM | POA: Diagnosis not present

## 2023-08-11 DIAGNOSIS — M48062 Spinal stenosis, lumbar region with neurogenic claudication: Secondary | ICD-10-CM | POA: Diagnosis not present

## 2023-08-11 DIAGNOSIS — R269 Unspecified abnormalities of gait and mobility: Secondary | ICD-10-CM | POA: Diagnosis not present

## 2023-08-11 DIAGNOSIS — M79605 Pain in left leg: Secondary | ICD-10-CM | POA: Diagnosis not present

## 2023-09-02 DIAGNOSIS — M79605 Pain in left leg: Secondary | ICD-10-CM | POA: Diagnosis not present

## 2023-09-02 DIAGNOSIS — M79604 Pain in right leg: Secondary | ICD-10-CM | POA: Diagnosis not present

## 2023-09-02 DIAGNOSIS — M48062 Spinal stenosis, lumbar region with neurogenic claudication: Secondary | ICD-10-CM | POA: Diagnosis not present

## 2023-09-02 DIAGNOSIS — R269 Unspecified abnormalities of gait and mobility: Secondary | ICD-10-CM | POA: Diagnosis not present

## 2023-09-23 DIAGNOSIS — R269 Unspecified abnormalities of gait and mobility: Secondary | ICD-10-CM | POA: Diagnosis not present

## 2023-09-23 DIAGNOSIS — M79605 Pain in left leg: Secondary | ICD-10-CM | POA: Diagnosis not present

## 2023-09-23 DIAGNOSIS — M48062 Spinal stenosis, lumbar region with neurogenic claudication: Secondary | ICD-10-CM | POA: Diagnosis not present

## 2023-09-23 DIAGNOSIS — M79604 Pain in right leg: Secondary | ICD-10-CM | POA: Diagnosis not present

## 2023-09-29 DIAGNOSIS — M79604 Pain in right leg: Secondary | ICD-10-CM | POA: Diagnosis not present

## 2023-09-29 DIAGNOSIS — M48062 Spinal stenosis, lumbar region with neurogenic claudication: Secondary | ICD-10-CM | POA: Diagnosis not present

## 2023-09-29 DIAGNOSIS — R269 Unspecified abnormalities of gait and mobility: Secondary | ICD-10-CM | POA: Diagnosis not present

## 2023-09-29 DIAGNOSIS — M79605 Pain in left leg: Secondary | ICD-10-CM | POA: Diagnosis not present

## 2023-10-04 ENCOUNTER — Encounter: Payer: Self-pay | Admitting: Pharmacist

## 2023-10-05 DIAGNOSIS — R269 Unspecified abnormalities of gait and mobility: Secondary | ICD-10-CM | POA: Diagnosis not present

## 2023-10-05 DIAGNOSIS — M48062 Spinal stenosis, lumbar region with neurogenic claudication: Secondary | ICD-10-CM | POA: Diagnosis not present

## 2023-10-05 DIAGNOSIS — M79605 Pain in left leg: Secondary | ICD-10-CM | POA: Diagnosis not present

## 2023-10-05 DIAGNOSIS — M79604 Pain in right leg: Secondary | ICD-10-CM | POA: Diagnosis not present

## 2023-11-16 ENCOUNTER — Other Ambulatory Visit: Payer: Self-pay | Admitting: Internal Medicine

## 2023-11-16 DIAGNOSIS — Z1231 Encounter for screening mammogram for malignant neoplasm of breast: Secondary | ICD-10-CM

## 2023-12-09 ENCOUNTER — Ambulatory Visit
Admission: RE | Admit: 2023-12-09 | Discharge: 2023-12-09 | Disposition: A | Discharge status: Home / Self Care | Source: Ambulatory Visit | Attending: Internal Medicine | Admitting: Internal Medicine

## 2023-12-09 DIAGNOSIS — Z1231 Encounter for screening mammogram for malignant neoplasm of breast: Secondary | ICD-10-CM

## 2023-12-14 DIAGNOSIS — L814 Other melanin hyperpigmentation: Secondary | ICD-10-CM | POA: Diagnosis not present

## 2023-12-14 DIAGNOSIS — L821 Other seborrheic keratosis: Secondary | ICD-10-CM | POA: Diagnosis not present

## 2023-12-14 DIAGNOSIS — D2262 Melanocytic nevi of left upper limb, including shoulder: Secondary | ICD-10-CM | POA: Diagnosis not present

## 2023-12-14 DIAGNOSIS — Z85828 Personal history of other malignant neoplasm of skin: Secondary | ICD-10-CM | POA: Diagnosis not present

## 2023-12-14 DIAGNOSIS — D225 Melanocytic nevi of trunk: Secondary | ICD-10-CM | POA: Diagnosis not present

## 2023-12-14 DIAGNOSIS — D2272 Melanocytic nevi of left lower limb, including hip: Secondary | ICD-10-CM | POA: Diagnosis not present

## 2023-12-14 DIAGNOSIS — B351 Tinea unguium: Secondary | ICD-10-CM | POA: Diagnosis not present

## 2023-12-14 DIAGNOSIS — L570 Actinic keratosis: Secondary | ICD-10-CM | POA: Diagnosis not present

## 2024-01-28 DIAGNOSIS — Z1212 Encounter for screening for malignant neoplasm of rectum: Secondary | ICD-10-CM | POA: Diagnosis not present

## 2024-01-28 DIAGNOSIS — I10 Essential (primary) hypertension: Secondary | ICD-10-CM | POA: Diagnosis not present

## 2024-01-28 DIAGNOSIS — E785 Hyperlipidemia, unspecified: Secondary | ICD-10-CM | POA: Diagnosis not present

## 2024-01-28 DIAGNOSIS — M810 Age-related osteoporosis without current pathological fracture: Secondary | ICD-10-CM | POA: Diagnosis not present

## 2024-02-09 DIAGNOSIS — F329 Major depressive disorder, single episode, unspecified: Secondary | ICD-10-CM | POA: Diagnosis not present

## 2024-02-09 DIAGNOSIS — J45909 Unspecified asthma, uncomplicated: Secondary | ICD-10-CM | POA: Diagnosis not present

## 2024-02-09 DIAGNOSIS — M810 Age-related osteoporosis without current pathological fracture: Secondary | ICD-10-CM | POA: Diagnosis not present

## 2024-02-09 DIAGNOSIS — E785 Hyperlipidemia, unspecified: Secondary | ICD-10-CM | POA: Diagnosis not present

## 2024-02-09 DIAGNOSIS — G720 Drug-induced myopathy: Secondary | ICD-10-CM | POA: Diagnosis not present

## 2024-02-09 DIAGNOSIS — I700 Atherosclerosis of aorta: Secondary | ICD-10-CM | POA: Diagnosis not present

## 2024-02-09 DIAGNOSIS — Z23 Encounter for immunization: Secondary | ICD-10-CM | POA: Diagnosis not present

## 2024-02-09 DIAGNOSIS — Z1331 Encounter for screening for depression: Secondary | ICD-10-CM | POA: Diagnosis not present

## 2024-02-09 DIAGNOSIS — I10 Essential (primary) hypertension: Secondary | ICD-10-CM | POA: Diagnosis not present

## 2024-02-09 DIAGNOSIS — I2510 Atherosclerotic heart disease of native coronary artery without angina pectoris: Secondary | ICD-10-CM | POA: Diagnosis not present

## 2024-02-09 DIAGNOSIS — Z1339 Encounter for screening examination for other mental health and behavioral disorders: Secondary | ICD-10-CM | POA: Diagnosis not present

## 2024-02-09 DIAGNOSIS — R82998 Other abnormal findings in urine: Secondary | ICD-10-CM | POA: Diagnosis not present

## 2024-02-20 NOTE — Progress Notes (Unsigned)
 Cardiology Office Note:    Date:  02/21/2024   ID:  Carla Huber, DOB 01/03/1946, MRN [id number]  PCP:  Carla Anes, MD  Cardiologist:  Aleene Passe, MD (Inactive)  Electrophysiologist:  None   Referring MD: Carla Anes, MD   Chief Complaint  Patient presents with   Coronary Artery Disease    History of Present Illness:    Carla Huber is a 78 y.o. female with a hx of CAD, hypertension, hyperlipidemia who presents for follow-up.  Previously followed with Dr. Passe.  Coronary CTA 02/2023 showed nonobstructive CAD with 25 to 49% stenosis in mid LAD, D1, mid/distal RCA; calcium  score 589 (87th percentile).  Since last clinic visit, she reports she is doing okay.  States that she has been having chest pain when she walks uphill.  Has been a new symptom.  Reports pain is in center chest, feels like her chest is being pressed, typically lasts for couple minutes.  Also reports shortness of breath with walking uphill.    Past Medical History:  Diagnosis Date   Coronary artery calcification 07/26/2015   Cough 02/21/2015   Dyspnea 02/21/2015   Exposure to respiratory irritant 02/21/2015   Insomnia    Palpitations 05/09/2014    Past Surgical History:  Procedure Laterality Date   APPENDECTOMY  1970    Current Medications: Current Meds  Medication Sig   aspirin  EC 81 MG tablet Take 1 tablet (81 mg total) by mouth daily.   b complex vitamins tablet Take 1 tablet by mouth daily.   cyclobenzaprine (FLEXERIL) 5 MG tablet daily as needed.   Fish Oil-Cholecalciferol (FISH OIL + D3) 1000-1000 MG-UNIT CAPS Take 1,000 mg by mouth daily.   furosemide (LASIX) 20 MG tablet Take 20 mg by mouth every other day.   ibandronate (BONIVA) 150 MG tablet 1 tablet 60 minutes before the first food, beverage or medicine of the day with plain water Orally once a month; Duration: 90 days   Magnesium 250 MG TABS Take 1 tablet by mouth once a day Oral   nebivolol (BYSTOLIC) 5 MG tablet Take 5 mg  by mouth at bedtime.   nitroGLYCERIN  (NITROSTAT ) 0.4 MG SL tablet Place 1 tablet (0.4 mg total) under the tongue every 5 (five) minutes as needed for chest pain.   potassium chloride  (KLOR-CON ) 10 MEQ tablet Take 10 mEq by mouth every other day.   telmisartan  (MICARDIS ) 80 MG tablet Take 1 tablet (80 mg total) by mouth daily.   traZODone (DESYREL) 100 MG tablet Take 1 tablet by mouth daily.   vitamin C (ASCORBIC ACID) 250 MG tablet Take 250 mg by mouth daily.   VITAMIN D PO Take 1,000 mg by mouth daily.     Allergies:   Amlodipine besylate, Hydrochlorothiazide , Other, Repatha  [evolocumab ], Statins, and Thiazide-type diuretics   Social History   Socioeconomic History   Marital status: Married    Spouse name: Not on file   Number of children: Not on file   Years of education: Not on file   Highest education level: Not on file  Occupational History   Not on file  Tobacco Use   Smoking status: Never   Smokeless tobacco: Never  Substance and Sexual Activity   Alcohol  use: Yes    Alcohol /week: 0.0 standard drinks of alcohol     Comment: DAILY GLASS OF WINE   Drug use: No   Sexual activity: Not on file  Other Topics Concern   Not on file  Social History Narrative  Not on file   Social Drivers of Health   Financial Resource Strain: Not on file  Food Insecurity: Not on file  Transportation Needs: Not on file  Physical Activity: Not on file  Stress: Not on file  Social Connections: Not on file     Family History: The patient's family history includes Breast cancer (age of onset: 66 - 74) in her paternal aunt; Heart attack in her father, maternal grandfather, maternal grandmother, paternal grandfather, and paternal grandmother; Heart failure in her mother; Lymphoma in her sister; Uterine cancer in her mother.  ROS:   Please see the history of present illness.     All other systems reviewed and are negative.  EKGs/Labs/Other Studies Reviewed:    The following studies were  reviewed today:   EKG:   02/21/2024: Sinus bradycardia, rate 55, first-degree AV block, Q waves in leads III, aVF and V1-4   Recent Labs: 05/11/2023: ALT 23; BUN 18; Creatinine, Ser 1.04; Potassium 4.4; Sodium 138  Recent Lipid Panel    Component Value Date/Time   CHOL 200 (H) 05/11/2023 0900   TRIG 54 05/11/2023 0900   HDL 70 05/11/2023 0900   CHOLHDL 2.9 05/11/2023 0900   LDLCALC 120 (H) 05/11/2023 0900    Physical Exam:    VS:  BP (!) 189/96 (BP Location: Left Arm, Patient Position: Sitting, Cuff Size: Normal)   Pulse (!) 55   Ht 5' 5 (1.651 m)   SpO2 99%   BMI 26.26 kg/m     Wt Readings from Last 3 Encounters:  02/01/23 157 lb 12.8 oz (71.6 kg)  12/16/21 158 lb 3.2 oz (71.8 kg)  12/19/20 154 lb 9.6 oz (70.1 kg)     GEN:  Well nourished, well developed in no acute distress HEENT: Normal NECK: No JVD; No carotid bruits LYMPHATICS: No lymphadenopathy CARDIAC: RRR, no murmurs, rubs, gallops RESPIRATORY:  Clear to auscultation without rales, wheezing or rhonchi  ABDOMEN: Soft, non-tender, non-distended MUSCULOSKELETAL:  No edema; No deformity  SKIN: Warm and dry NEUROLOGIC:  Alert and oriented x 3 PSYCHIATRIC:  Normal affect   ASSESSMENT:    1. Coronary artery disease involving native coronary artery of native heart, unspecified whether angina present   2. Chest pain of uncertain etiology   3. Primary hypertension   4. Mixed hyperlipidemia    PLAN:    CAD: Coronary CTA 02/2023 showed nonobstructive CAD with 25 to 49% stenosis in mid LAD, D1, mid/distal RCA; calcium  score 589 (87th percentile). - Continue aspirin  81 mg daily - Has been intolerant to statins and Repatha  due to leg pain.  However reports her leg pain did not improve with stopping her cholesterol medication.  She has since been diagnosed with spinal stenosis and thinks this is likely the cause of her leg pain.  Recommend restarting Repatha , will refer to pharmacy lipid clinic - She is reporting  symptoms concerning for typical angina, describes recently having chest pain walking uphill.  Recommend stress PET for further evaluation.  Hypertension: On Nebivolol 5 mg daily and telmisartan  80 mg daily.  Also takes Lasix 20 mg every other day.  BP elevated in clinic but reports under control when checks at home.  Asked to check BP twice daily for next week and let us  know results.  Hyperlipidemia: Has been intolerant to statins and Repatha  due to leg pain.  However reports her leg pain did not improve with stopping her cholesterol medication.  She has since been diagnosed with spinal stenosis and thinks  this is likely the cause of her leg pain.  Recommend restarting Repatha , will refer to pharmacy lipid clinic   RTC in 4 months  Informed Consent   Shared Decision Making/Informed Consent The risks [chest pain, shortness of breath, cardiac arrhythmias, dizziness, blood pressure fluctuations, myocardial infarction, stroke/transient ischemic attack, nausea, vomiting, allergic reaction, radiation exposure, metallic taste sensation and life-threatening complications (estimated to be 1 in 10,000)], benefits (risk stratification, diagnosing coronary artery disease, treatment guidance) and alternatives of a cardiac PET stress test were discussed in detail with Ms. Lafferty and she agrees to proceed.       Medication Adjustments/Labs and Tests Ordered: Current medicines are reviewed at length with the patient today.  Concerns regarding medicines are outlined above.  Orders Placed This Encounter  Procedures   NM PET CT CARDIAC PERFUSION MULTI W/ABSOLUTE BLOODFLOW   AMB Referral to [facility] Pharm-D   EKG 12-Lead   No orders of the defined types were placed in this encounter.   Patient Instructions  Medication Instructions:  Your physician recommends that you continue on your current medications as directed. Please refer to the Current Medication list given to you today.  *If you need a refill  on your cardiac medications before your next appointment, please call your pharmacy*  Lab Work: none If you have labs (blood work) drawn today and your tests are completely normal, you will receive your results only by: MyChart Message (if you have MyChart) OR A paper copy in the mail If you have any lab test that is abnormal or we need to change your treatment, we will call you to review the results.  Testing/Procedures:    Please report to Radiology at the [facility] Main Entrance 30 minutes early for your test.  [address] [place], [place] [address]                         How to Prepare for Your Cardiac PET/CT Stress Test:  Nothing to eat or drink, except water, 3 hours prior to arrival time.  NO caffeine/decaffeinated products, or chocolate 12 hours prior to arrival. (Please note decaffeinated beverages (teas/coffees) still contain caffeine).  If you have caffeine within 12 hours prior, the test will need to be rescheduled.  Medication instructions: Do not take erectile dysfunction medications for 72 hours prior to test (sildenafil, tadalafil) Do not take nitrates (isosorbide mononitrate, Ranexa) the day before or day of test Do not take tamsulosin the day before or morning of test Hold theophylline containing medications for 12 hours. Hold Dipyridamole 48 hours prior to the test.  Diabetic Preparation: If able to eat breakfast prior to 3 hour fasting, you may take all medications, including your insulin. Do not worry if you miss your breakfast dose of insulin - start at your next meal. If you do not eat prior to 3 hour fast-Hold all diabetes (oral and insulin) medications. Patients who wear a continuous glucose monitor MUST remove the device prior to scanning.  You may take your remaining medications with water.  NO perfume, cologne or lotion on chest or abdomen area. FEMALES - Please avoid wearing dresses to this appointment.  Total time is 1 to 2 hours;  you may want to bring reading material for the waiting time.  IF YOU THINK YOU MAY BE PREGNANT, OR ARE NURSING PLEASE INFORM THE TECHNOLOGIST.  In preparation for your appointment, medication and supplies will be purchased.  Appointment availability is limited, so  if you need to cancel or reschedule, please call the Radiology Department Scheduler at [phone] 24 hours in advance to avoid a cancellation fee of $100.00  What to Expect When you Arrive:  Once you arrive and check in for your appointment, you will be taken to a preparation room within the Radiology Department.  A technologist or Nurse will obtain your medical history, verify that you are correctly prepped for the exam, and explain the procedure.  Afterwards, an IV will be started in your arm and electrodes will be placed on your skin for EKG monitoring during the stress portion of the exam. Then you will be escorted to the PET/CT scanner.  There, staff will get you positioned on the scanner and obtain a blood pressure and EKG.  During the exam, you will continue to be connected to the EKG and blood pressure machines.  A small, safe amount of a radioactive tracer will be injected in your IV to obtain a series of pictures of your heart along with an injection of a stress agent.    After your Exam:  It is recommended that you eat a meal and drink a caffeinated beverage to counter act any effects of the stress agent.  Drink plenty of fluids for the remainder of the day and urinate frequently for the first couple of hours after the exam.  Your doctor will inform you of your test results within 7-10 business days.  For more information and frequently asked questions, please visit our website: [email or web address]  For questions about your test or how to prepare for your test, please call: Cardiac Imaging Nurse Navigators Office: [phone]   Follow-Up: At [facility], you and your health needs are our  priority.  As part of our continuing mission to provide you with exceptional heart care, our providers are all part of one team.  This team includes your primary Cardiologist (physician) and Advanced Practice Providers or APPs (Physician Assistants and Nurse Practitioners) who all work together to provide you with the care you need, when you need it.  Your next appointment:   4 months  Provider:   Dr. Kate  We recommend signing up for the patient portal called MyChart.  Sign up information is provided on this After Visit Summary.  MyChart is used to connect with patients for Virtual Visits (Telemedicine).  Patients are able to view lab/test results, encounter notes, upcoming appointments, etc.  Non-urgent messages can be sent to your provider as well.   To learn more about what you can do with MyChart, go to [email or web address].   Other Instructions Referral to Pharm D  Please check blood pressure twice a day for next week and send those readings           Signed, Lonni LITTIE Kate, MD  02/21/2024 5:13 PM    [facility]

## 2024-02-21 ENCOUNTER — Encounter: Payer: Self-pay | Admitting: Cardiology

## 2024-02-21 ENCOUNTER — Ambulatory Visit: Attending: Cardiology | Admitting: Cardiology

## 2024-02-21 VITALS — BP 189/96 | HR 55 | Ht 65.0 in

## 2024-02-21 DIAGNOSIS — I2510 Atherosclerotic heart disease of native coronary artery without angina pectoris: Secondary | ICD-10-CM | POA: Diagnosis not present

## 2024-02-21 DIAGNOSIS — I10 Essential (primary) hypertension: Secondary | ICD-10-CM

## 2024-02-21 DIAGNOSIS — E782 Mixed hyperlipidemia: Secondary | ICD-10-CM | POA: Diagnosis not present

## 2024-02-21 DIAGNOSIS — R079 Chest pain, unspecified: Secondary | ICD-10-CM

## 2024-02-21 NOTE — Patient Instructions (Addendum)
 Medication Instructions:  Your physician recommends that you continue on your current medications as directed. Please refer to the Current Medication list given to you today.  *If you need a refill on your cardiac medications before your next appointment, please call your pharmacy*  Lab Work: none If you have labs (blood work) drawn today and your tests are completely normal, you will receive your results only by: MyChart Message (if you have MyChart) OR A paper copy in the mail If you have any lab test that is abnormal or we need to change your treatment, we will call you to review the results.  Testing/Procedures:    Please report to Radiology at the [facility] Main Entrance 30 minutes early for your test.  [address] [place], [place] [address]                         How to Prepare for Your Cardiac PET/CT Stress Test:  Nothing to eat or drink, except water, 3 hours prior to arrival time.  NO caffeine/decaffeinated products, or chocolate 12 hours prior to arrival. (Please note decaffeinated beverages (teas/coffees) still contain caffeine).  If you have caffeine within 12 hours prior, the test will need to be rescheduled.  Medication instructions: Do not take erectile dysfunction medications for 72 hours prior to test (sildenafil, tadalafil) Do not take nitrates (isosorbide mononitrate, Ranexa) the day before or day of test Do not take tamsulosin the day before or morning of test Hold theophylline containing medications for 12 hours. Hold Dipyridamole 48 hours prior to the test.  Diabetic Preparation: If able to eat breakfast prior to 3 hour fasting, you may take all medications, including your insulin. Do not worry if you miss your breakfast dose of insulin - start at your next meal. If you do not eat prior to 3 hour fast-Hold all diabetes (oral and insulin) medications. Patients who wear a continuous glucose monitor MUST remove the device prior to  scanning.  You may take your remaining medications with water.  NO perfume, cologne or lotion on chest or abdomen area. FEMALES - Please avoid wearing dresses to this appointment.  Total time is 1 to 2 hours; you may want to bring reading material for the waiting time.  IF YOU THINK YOU MAY BE PREGNANT, OR ARE NURSING PLEASE INFORM THE TECHNOLOGIST.  In preparation for your appointment, medication and supplies will be purchased.  Appointment availability is limited, so if you need to cancel or reschedule, please call the Radiology Department Scheduler at [phone] 24 hours in advance to avoid a cancellation fee of $100.00  What to Expect When you Arrive:  Once you arrive and check in for your appointment, you will be taken to a preparation room within the Radiology Department.  A technologist or Nurse will obtain your medical history, verify that you are correctly prepped for the exam, and explain the procedure.  Afterwards, an IV will be started in your arm and electrodes will be placed on your skin for EKG monitoring during the stress portion of the exam. Then you will be escorted to the PET/CT scanner.  There, staff will get you positioned on the scanner and obtain a blood pressure and EKG.  During the exam, you will continue to be connected to the EKG and blood pressure machines.  A small, safe amount of a radioactive tracer will be injected in your IV to obtain a series of pictures of your heart along  with an injection of a stress agent.    After your Exam:  It is recommended that you eat a meal and drink a caffeinated beverage to counter act any effects of the stress agent.  Drink plenty of fluids for the remainder of the day and urinate frequently for the first couple of hours after the exam.  Your doctor will inform you of your test results within 7-10 business days.  For more information and frequently asked questions, please visit our  website: [email or web address]  For questions about your test or how to prepare for your test, please call: Cardiac Imaging Nurse Navigators Office: [phone]   Follow-Up: At [facility], you and your health needs are our priority.  As part of our continuing mission to provide you with exceptional heart care, our providers are all part of one team.  This team includes your primary Cardiologist (physician) and Advanced Practice Providers or APPs (Physician Assistants and Nurse Practitioners) who all work together to provide you with the care you need, when you need it.  Your next appointment:   4 months  Provider:   Dr. Kate  We recommend signing up for the patient portal called MyChart.  Sign up information is provided on this After Visit Summary.  MyChart is used to connect with patients for Virtual Visits (Telemedicine).  Patients are able to view lab/test results, encounter notes, upcoming appointments, etc.  Non-urgent messages can be sent to your provider as well.   To learn more about what you can do with MyChart, go to [email or web address].   Other Instructions Referral to Pharm D  Please check blood pressure twice a day for next week and send those readings

## 2024-04-11 ENCOUNTER — Other Ambulatory Visit (HOSPITAL_COMMUNITY)

## 2024-04-12 ENCOUNTER — Ambulatory Visit: Admitting: Pharmacist

## 2024-04-21 ENCOUNTER — Encounter (HOSPITAL_COMMUNITY): Payer: Self-pay

## 2024-04-25 ENCOUNTER — Encounter (HOSPITAL_COMMUNITY)
Admission: RE | Admit: 2024-04-25 | Discharge: 2024-04-25 | Disposition: A | Source: Ambulatory Visit | Attending: Cardiology

## 2024-04-25 DIAGNOSIS — R079 Chest pain, unspecified: Secondary | ICD-10-CM | POA: Diagnosis not present

## 2024-04-25 LAB — NM PET CT CARDIAC PERFUSION MULTI W/ABSOLUTE BLOODFLOW
MBFR: 2
Nuc Rest EF: 51 %
Nuc Stress EF: 56 %
Rest MBF: 0.84 ml/g/min
Rest Nuclear Isotope Dose: 18.6 mCi
ST Depression (mm): 0 mm
Stress MBF: 1.68 ml/g/min
Stress Nuclear Isotope Dose: 18.4 mCi
TID: 1

## 2024-04-25 MED ORDER — RUBIDIUM RB82 GENERATOR (RUBYFILL)
18.6100 | PACK | Freq: Once | INTRAVENOUS | Status: AC
  Administered 2024-04-25: 18.61 via INTRAVENOUS

## 2024-04-25 MED ORDER — RUBIDIUM RB82 GENERATOR (RUBYFILL)
18.3600 | PACK | Freq: Once | INTRAVENOUS | Status: AC
  Administered 2024-04-25: 18.36 via INTRAVENOUS

## 2024-04-25 MED ORDER — REGADENOSON 0.4 MG/5ML IV SOLN
0.4000 mg | Freq: Once | INTRAVENOUS | Status: AC
  Administered 2024-04-25: 0.4 mg via INTRAVENOUS

## 2024-04-25 MED ORDER — REGADENOSON 0.4 MG/5ML IV SOLN
INTRAVENOUS | Status: AC
  Filled 2024-04-25: qty 5

## 2024-04-26 ENCOUNTER — Ambulatory Visit: Payer: Self-pay | Admitting: Cardiology

## 2024-05-09 NOTE — Progress Notes (Signed)
 Patient ID: VERBLE STYRON                 DOB: [birth year]                    MRN: [id number]      HPI: Carla Huber is a 79 y.o. female patient referred to lipid clinic by Dr. Kate. PMH is significant for CAD, HTN, and HLD. Coronary CTA 02/2023 showed nonobstructive CAD with 25 to 49% stenosis in mid LAD, D1, mid/distal RCA; calcium  score 589 (87th percentile).   During last visit with Dr. Kate, the patient reported new chest pain when walking uphill. She was instructed to continue aspirin  and to start Repatha . Of note, she has a history of intolerance to statin and Repatha  due to leg pain; however, the patient states that the pain did not improve after stopping these medications. She was diagnosed with spinal stenosis, which she believes is the cause of the pain.   Patient reports today to lipid clinic.  She does not remember taking Nexlizet .  Her last fill was in April 2025.  She believes that her spinal stenosis is a major cause of her leg pain and would like to resume Repatha .  Current Medications: None Intolerances:  crestor 5 and lipitor 20mg  - developed leg pain, Repatha , but pain did not resolve upon discontinuation Risk Factors: CAC Score of 589, >65YO, HTN, HLD LDL-C goal:  <70 mg/dL  Diet:  Red meat 1-2 times per week Chicken Needs more vegetables Cookies, crackers Drinks water  Exercise: walk 1-1.5 miles daily, stretches  Family History:  Mother: HF Father: MI  Social History:  Tobacco: never  EtOH: 1-2 drinks daily  Labs: Lipid Panel     Component Value Date/Time   CHOL 200 (H) 05/11/2023 0900   TRIG 54 05/11/2023 0900   HDL 70 05/11/2023 0900   CHOLHDL 2.9 05/11/2023 0900   LDLCALC 120 (H) 05/11/2023 0900   LABVLDL 10 05/11/2023 0900   Past Medical History:  Diagnosis Date   Coronary artery calcification 07/26/2015   Cough 02/21/2015   Dyspnea 02/21/2015   Exposure to respiratory irritant 02/21/2015   Insomnia    Palpitations 05/09/2014    Medications Ordered Prior to Encounter[1]  Allergies[2]  Assessment/Plan:  1. Hyperlipidemia -  Hyperlipidemia Assessment: Currently on no cholesterol treatment Was diagnosed with spinal stenosis and now believes this is the cause of her leg pain, not Repatha  Intolerant to rosuvastatin 5mg  and lipitor 20mg  High risk due to CAC score, age, HTN, HLD Walks daily, but no resistance training. Recommended adding resistance exercises-recommendations from [facility] given Decrease alcohol  to 1 drink per day or less  Plan: Resume Repatha - will submit new prior authorization Follow up labs in 3 months    Thank you,  Melissa D Maccia, Pharm.JONETTA SARAN, CPP [facility] Division of [facility] [address] [place], [place] [address]  Phone: [phone]; Fax: [phone]      [1]  Current Outpatient Medications on File Prior to Visit  Medication Sig Dispense Refill   aspirin  EC 81 MG tablet Take 1 tablet (81 mg total) by mouth daily.     b complex vitamins tablet Take 1 tablet by mouth daily.     cyclobenzaprine (FLEXERIL) 5 MG tablet daily as needed.     Fish Oil-Cholecalciferol (FISH OIL + D3) 1000-1000 MG-UNIT CAPS Take 1,000 mg by mouth daily.     furosemide (LASIX) 20 MG tablet Take  20 mg by mouth every other day. (Patient taking differently: Take 20 mg by mouth daily as needed.)     ibandronate (BONIVA) 150 MG tablet 1 tablet 60 minutes before the first food, beverage or medicine of the day with plain water Orally once a month; Duration: 90 days     magnesium oxide (MAG-OX) 400 MG tablet Take 400 mg by mouth daily.     nebivolol (BYSTOLIC) 5 MG tablet Take 5 mg by mouth at bedtime.     nitroGLYCERIN  (NITROSTAT ) 0.4 MG SL tablet Place 1 tablet (0.4 mg total) under the tongue every 5 (five) minutes as needed for chest pain. 25 tablet 6   potassium chloride  (KLOR-CON ) 10 MEQ tablet Take 10 mEq by mouth every other day. (Patient taking differently: Take  10 mEq by mouth every other day. With furosemide only)     telmisartan  (MICARDIS ) 80 MG tablet Take 1 tablet (80 mg total) by mouth daily. 15 tablet 0   traZODone (DESYREL) 100 MG tablet Take 1 tablet by mouth daily.     vitamin C (ASCORBIC ACID) 250 MG tablet Take 250 mg by mouth daily.     VITAMIN D PO Take 1,000 mg by mouth daily.     No current facility-administered medications on file prior to visit.  [2]  Allergies Allergen Reactions   Amlodipine Besylate     Other reaction(s): swelling and fluid rentention   Hydrochlorothiazide  Photosensitivity    Pt reports photosensitivity, blurry vision, dizziness while taking this med   Other Other (See Comments)    Not sure   Repatha  [Evolocumab ] Other (See Comments)    Muscle aches    Statins     Other reaction(s): muscle aches   Thiazide-Type Diuretics     Other reaction(s): photosensitivity

## 2024-05-11 ENCOUNTER — Ambulatory Visit: Admitting: Pharmacist

## 2024-05-11 DIAGNOSIS — E782 Mixed hyperlipidemia: Secondary | ICD-10-CM

## 2024-05-11 NOTE — Patient Instructions (Signed)
 Carla Huber

## 2024-05-11 NOTE — Assessment & Plan Note (Addendum)
 Assessment: Currently on no cholesterol treatment Was diagnosed with spinal stenosis and now believes this is the cause of her leg pain, not Repatha  Intolerant to rosuvastatin 5mg  and lipitor 20mg  High risk due to CAC score, age, HTN, HLD Walks daily, but no resistance training. Recommended adding resistance exercises-recommendations from [facility] given Decrease alcohol  to 1 drink per day or less  Plan: Resume Repatha - will submit new prior authorization Follow up labs in 3 months

## 2024-05-17 ENCOUNTER — Telehealth: Payer: Self-pay | Admitting: Pharmacy Technician

## 2024-05-17 ENCOUNTER — Other Ambulatory Visit (HOSPITAL_COMMUNITY): Payer: Self-pay

## 2024-05-17 NOTE — Telephone Encounter (Signed)
 Pharmacy Patient Advocate Encounter  Received notification from [employer] that Prior Authorization for repatha  has been APPROVED from 05/17/24 to 04/26/25. Ran test claim, Copay is $40.00. This test claim was processed through [facility]- copay amounts may vary at other pharmacies due to pharmacy/plan contracts, or as the patient moves through the different stages of their insurance plan.   PA #/Case ID/Reference #: [id number]

## 2024-05-17 NOTE — Telephone Encounter (Signed)
" ° °  Pharmacy Patient Advocate Encounter   Received notification from cc chart that prior authorization for repatha  is required/requested.   Insurance verification completed.   The patient is insured through [employer].   Per test claim: PA required; PA submitted to above mentioned insurance via Latent Key/confirmation #/EOC [id number] Status is pending  "

## 2024-05-18 ENCOUNTER — Encounter: Payer: Self-pay | Admitting: Pharmacist

## 2024-05-18 MED ORDER — REPATHA SURECLICK 140 MG/ML ~~LOC~~ SOAJ: 1.0000 mL | SUBCUTANEOUS | 3 refills | Status: AC

## 2024-05-18 NOTE — Telephone Encounter (Signed)
 Patient made aware. Rx sent. Labs in 3 months

## 2024-05-18 NOTE — Addendum Note (Signed)
 Addended by: Jeff Mccallum D on: 05/18/2024 01:07 PM   Modules accepted: Orders

## 2024-06-21 ENCOUNTER — Ambulatory Visit: Admitting: Cardiology
